# Patient Record
Sex: Male | Born: 1935 | Race: White | Hispanic: No | Marital: Married | Smoking: Former smoker
Health system: Southern US, Community
[De-identification: ages and names within clinical notes are randomized; demographics above are authoritative.]

## PROBLEM LIST (undated history)

## (undated) DIAGNOSIS — D369 Benign neoplasm, unspecified site: Secondary | ICD-10-CM

## (undated) DIAGNOSIS — I2510 Atherosclerotic heart disease of native coronary artery without angina pectoris: Secondary | ICD-10-CM

## (undated) DIAGNOSIS — H35039 Hypertensive retinopathy, unspecified eye: Secondary | ICD-10-CM

## (undated) DIAGNOSIS — N183 Chronic kidney disease, stage 3 unspecified: Secondary | ICD-10-CM

## (undated) DIAGNOSIS — G510 Bell's palsy: Secondary | ICD-10-CM

## (undated) DIAGNOSIS — C439 Malignant melanoma of skin, unspecified: Secondary | ICD-10-CM

## (undated) DIAGNOSIS — I452 Bifascicular block: Secondary | ICD-10-CM

## (undated) DIAGNOSIS — M4800 Spinal stenosis, site unspecified: Secondary | ICD-10-CM

## (undated) DIAGNOSIS — E785 Hyperlipidemia, unspecified: Secondary | ICD-10-CM

## (undated) DIAGNOSIS — E119 Type 2 diabetes mellitus without complications: Secondary | ICD-10-CM

## (undated) DIAGNOSIS — I10 Essential (primary) hypertension: Secondary | ICD-10-CM

## (undated) HISTORY — DX: Atherosclerotic heart disease of native coronary artery without angina pectoris: I25.10

## (undated) HISTORY — PX: OTHER SURGICAL HISTORY: SHX169

## (undated) HISTORY — PX: CORONARY ARTERY BYPASS GRAFT: SHX141

## (undated) HISTORY — DX: Malignant melanoma of skin, unspecified: C43.9

## (undated) HISTORY — DX: Essential (primary) hypertension: I10

## (undated) HISTORY — DX: Hyperlipidemia, unspecified: E78.5

## (undated) HISTORY — PX: TONSILLECTOMY AND ADENOIDECTOMY: SUR1326

## (undated) HISTORY — DX: Bifascicular block: I45.2

## (undated) HISTORY — DX: Spinal stenosis, site unspecified: M48.00

## (undated) HISTORY — PX: LAPAROSCOPIC APPENDECTOMY: SUR753

## (undated) HISTORY — DX: Chronic kidney disease, stage 3 unspecified: N18.30

## (undated) HISTORY — PX: MELANOMA EXCISION: SHX5266

## (undated) HISTORY — DX: Benign neoplasm, unspecified site: D36.9

## (undated) HISTORY — DX: Type 2 diabetes mellitus without complications: E11.9

## (undated) HISTORY — DX: Hypertensive retinopathy, unspecified eye: H35.039

## (undated) HISTORY — DX: Chronic kidney disease, stage 3 (moderate): N18.3

## (undated) HISTORY — DX: Bell's palsy: G51.0

---

## 2000-03-16 ENCOUNTER — Ambulatory Visit (HOSPITAL_COMMUNITY): Admission: RE | Admit: 2000-03-16 | Discharge: 2000-03-16 | Payer: Self-pay | Admitting: Gastroenterology

## 2000-03-16 ENCOUNTER — Encounter (INDEPENDENT_AMBULATORY_CARE_PROVIDER_SITE_OTHER): Payer: Self-pay | Admitting: *Deleted

## 2003-07-31 ENCOUNTER — Ambulatory Visit (HOSPITAL_COMMUNITY): Admission: RE | Admit: 2003-07-31 | Discharge: 2003-07-31 | Payer: Self-pay | Admitting: Gastroenterology

## 2003-07-31 ENCOUNTER — Encounter (INDEPENDENT_AMBULATORY_CARE_PROVIDER_SITE_OTHER): Payer: Self-pay | Admitting: *Deleted

## 2003-08-06 ENCOUNTER — Ambulatory Visit (HOSPITAL_COMMUNITY): Admission: RE | Admit: 2003-08-06 | Discharge: 2003-08-06 | Payer: Self-pay | Admitting: Orthopedic Surgery

## 2003-11-30 ENCOUNTER — Observation Stay (HOSPITAL_COMMUNITY): Admission: EM | Admit: 2003-11-30 | Discharge: 2003-12-01 | Payer: Self-pay | Admitting: Emergency Medicine

## 2003-11-30 ENCOUNTER — Encounter (INDEPENDENT_AMBULATORY_CARE_PROVIDER_SITE_OTHER): Payer: Self-pay | Admitting: *Deleted

## 2006-04-07 ENCOUNTER — Encounter: Admission: RE | Admit: 2006-04-07 | Discharge: 2006-04-07 | Payer: Self-pay | Admitting: Geriatric Medicine

## 2007-02-09 ENCOUNTER — Encounter: Payer: Self-pay | Admitting: Emergency Medicine

## 2007-02-09 ENCOUNTER — Inpatient Hospital Stay (HOSPITAL_COMMUNITY): Admission: AD | Admit: 2007-02-09 | Discharge: 2007-02-17 | Payer: Self-pay | Admitting: Cardiology

## 2007-02-11 ENCOUNTER — Encounter: Payer: Self-pay | Admitting: Cardiothoracic Surgery

## 2007-02-11 ENCOUNTER — Ambulatory Visit: Payer: Self-pay | Admitting: Cardiothoracic Surgery

## 2007-03-07 ENCOUNTER — Ambulatory Visit: Payer: Self-pay | Admitting: Cardiothoracic Surgery

## 2007-03-21 ENCOUNTER — Encounter (HOSPITAL_COMMUNITY): Admission: RE | Admit: 2007-03-21 | Discharge: 2007-05-15 | Payer: Self-pay | Admitting: Cardiology

## 2007-04-04 ENCOUNTER — Encounter: Admission: RE | Admit: 2007-04-04 | Discharge: 2007-04-04 | Payer: Self-pay | Admitting: Cardiothoracic Surgery

## 2007-04-04 ENCOUNTER — Ambulatory Visit: Payer: Self-pay | Admitting: Cardiothoracic Surgery

## 2007-04-05 ENCOUNTER — Ambulatory Visit (HOSPITAL_COMMUNITY): Admission: RE | Admit: 2007-04-05 | Discharge: 2007-04-05 | Payer: Self-pay | Admitting: Cardiothoracic Surgery

## 2007-04-05 ENCOUNTER — Encounter (INDEPENDENT_AMBULATORY_CARE_PROVIDER_SITE_OTHER): Payer: Self-pay | Admitting: Diagnostic Radiology

## 2007-04-18 ENCOUNTER — Ambulatory Visit: Payer: Self-pay | Admitting: Cardiothoracic Surgery

## 2007-04-18 ENCOUNTER — Encounter: Admission: RE | Admit: 2007-04-18 | Discharge: 2007-04-18 | Payer: Self-pay | Admitting: Cardiothoracic Surgery

## 2007-05-01 ENCOUNTER — Encounter: Admission: RE | Admit: 2007-05-01 | Discharge: 2007-05-01 | Payer: Self-pay | Admitting: Internal Medicine

## 2007-05-02 ENCOUNTER — Ambulatory Visit: Payer: Self-pay | Admitting: Cardiothoracic Surgery

## 2007-05-02 ENCOUNTER — Encounter: Admission: RE | Admit: 2007-05-02 | Discharge: 2007-05-02 | Payer: Self-pay | Admitting: Cardiothoracic Surgery

## 2007-05-16 ENCOUNTER — Encounter (HOSPITAL_COMMUNITY): Admission: RE | Admit: 2007-05-16 | Discharge: 2007-07-13 | Payer: Self-pay | Admitting: Cardiology

## 2007-06-04 ENCOUNTER — Ambulatory Visit (HOSPITAL_COMMUNITY): Admission: RE | Admit: 2007-06-04 | Discharge: 2007-06-05 | Payer: Self-pay | Admitting: Ophthalmology

## 2007-09-17 ENCOUNTER — Ambulatory Visit (HOSPITAL_COMMUNITY): Admission: RE | Admit: 2007-09-17 | Discharge: 2007-09-18 | Payer: Self-pay | Admitting: Ophthalmology

## 2007-12-06 IMAGING — CR DG CHEST DECUBITUS*L*
1 series · 1 of 1 positions shown · non-contrast
Comparison: Two views of the chest from [REDACTED] [place] from yesterday were reviewed.

CLINICAL DATA: Left pleural effusion.  
 LEFT LATERAL DECUBITUS CHEST:

[view not recorded]
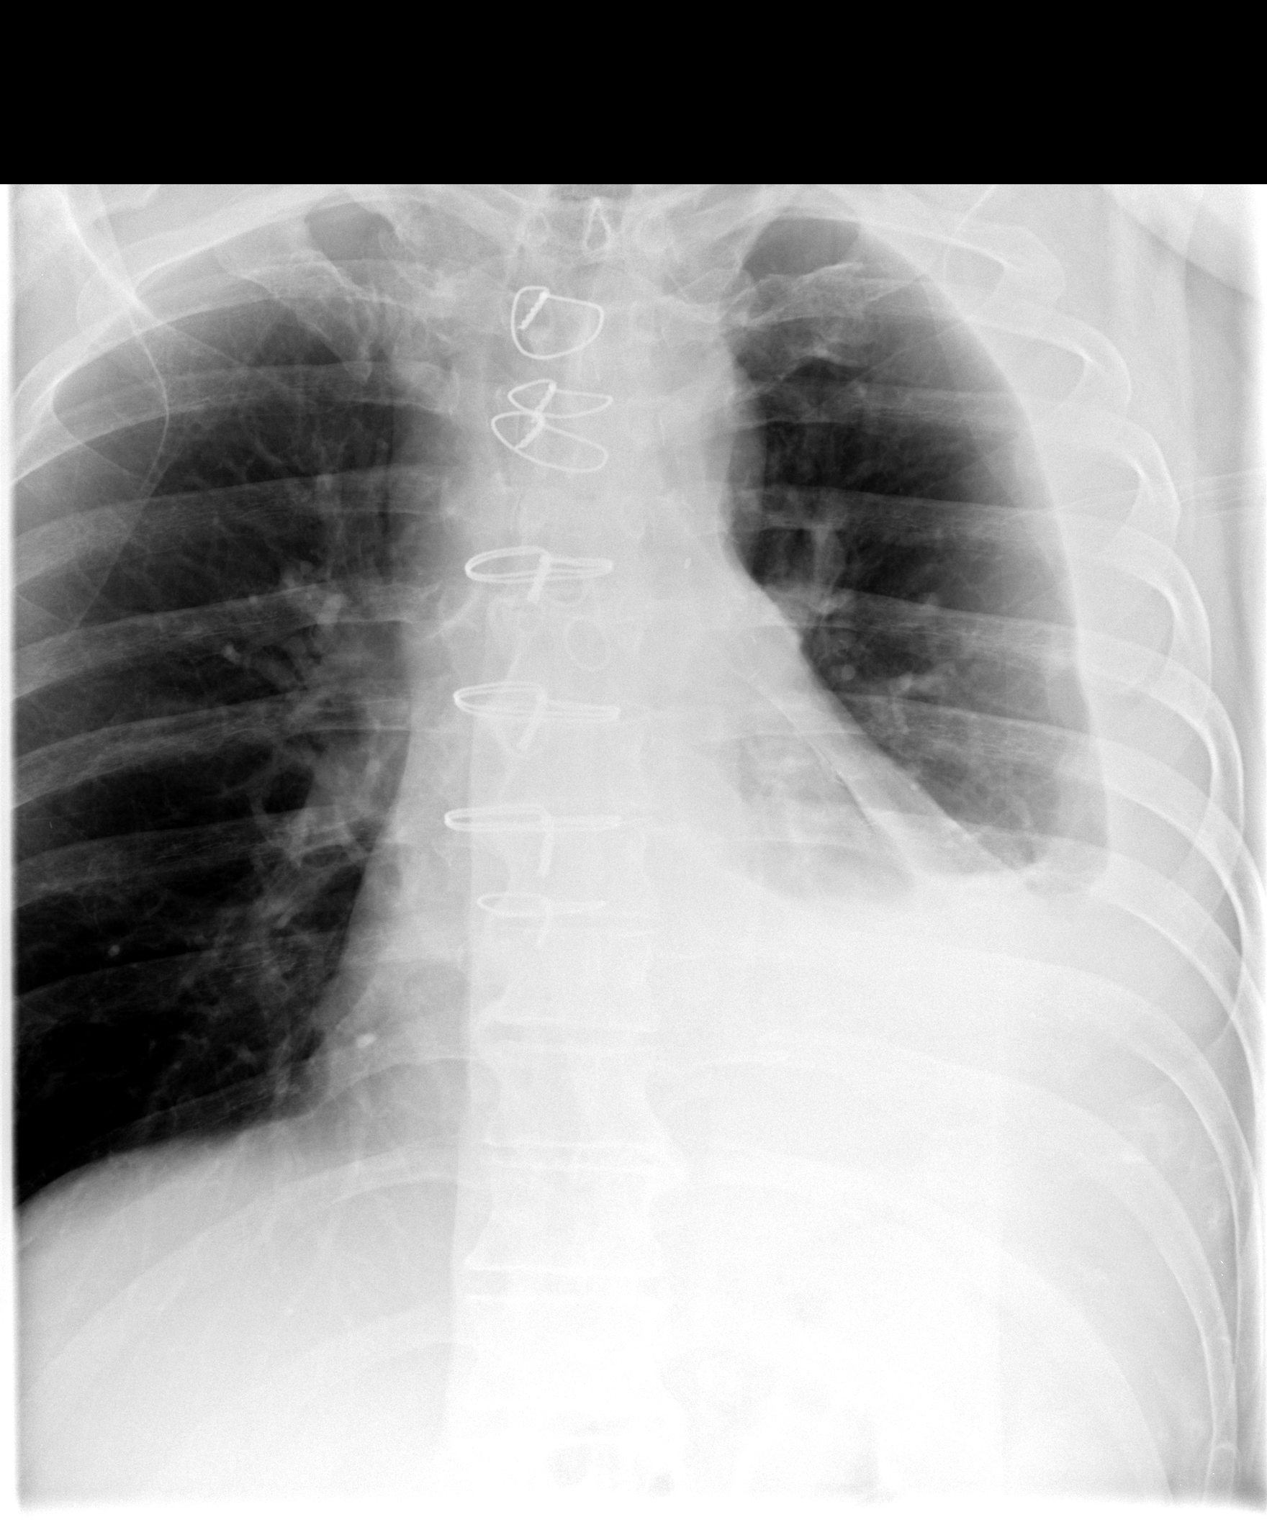

[1 of 1 positions shown; findings below may reference images not displayed]

FINDINGS: The left lateral decubitus shows a moderate size free flowing left pleural effusion to be present.  Cardiomegaly is again noted.
IMPRESSION: Moderate size free flowing left pleural effusion.

## 2007-12-07 IMAGING — CR DG CHEST 1V
1 series · 1 of 1 positions shown · non-contrast
Comparison: none

CLINICAL DATA: Left pleural effusion.  Status post thoracentesis.  
 CHEST ? 1 VIEW:

[w chest pa]
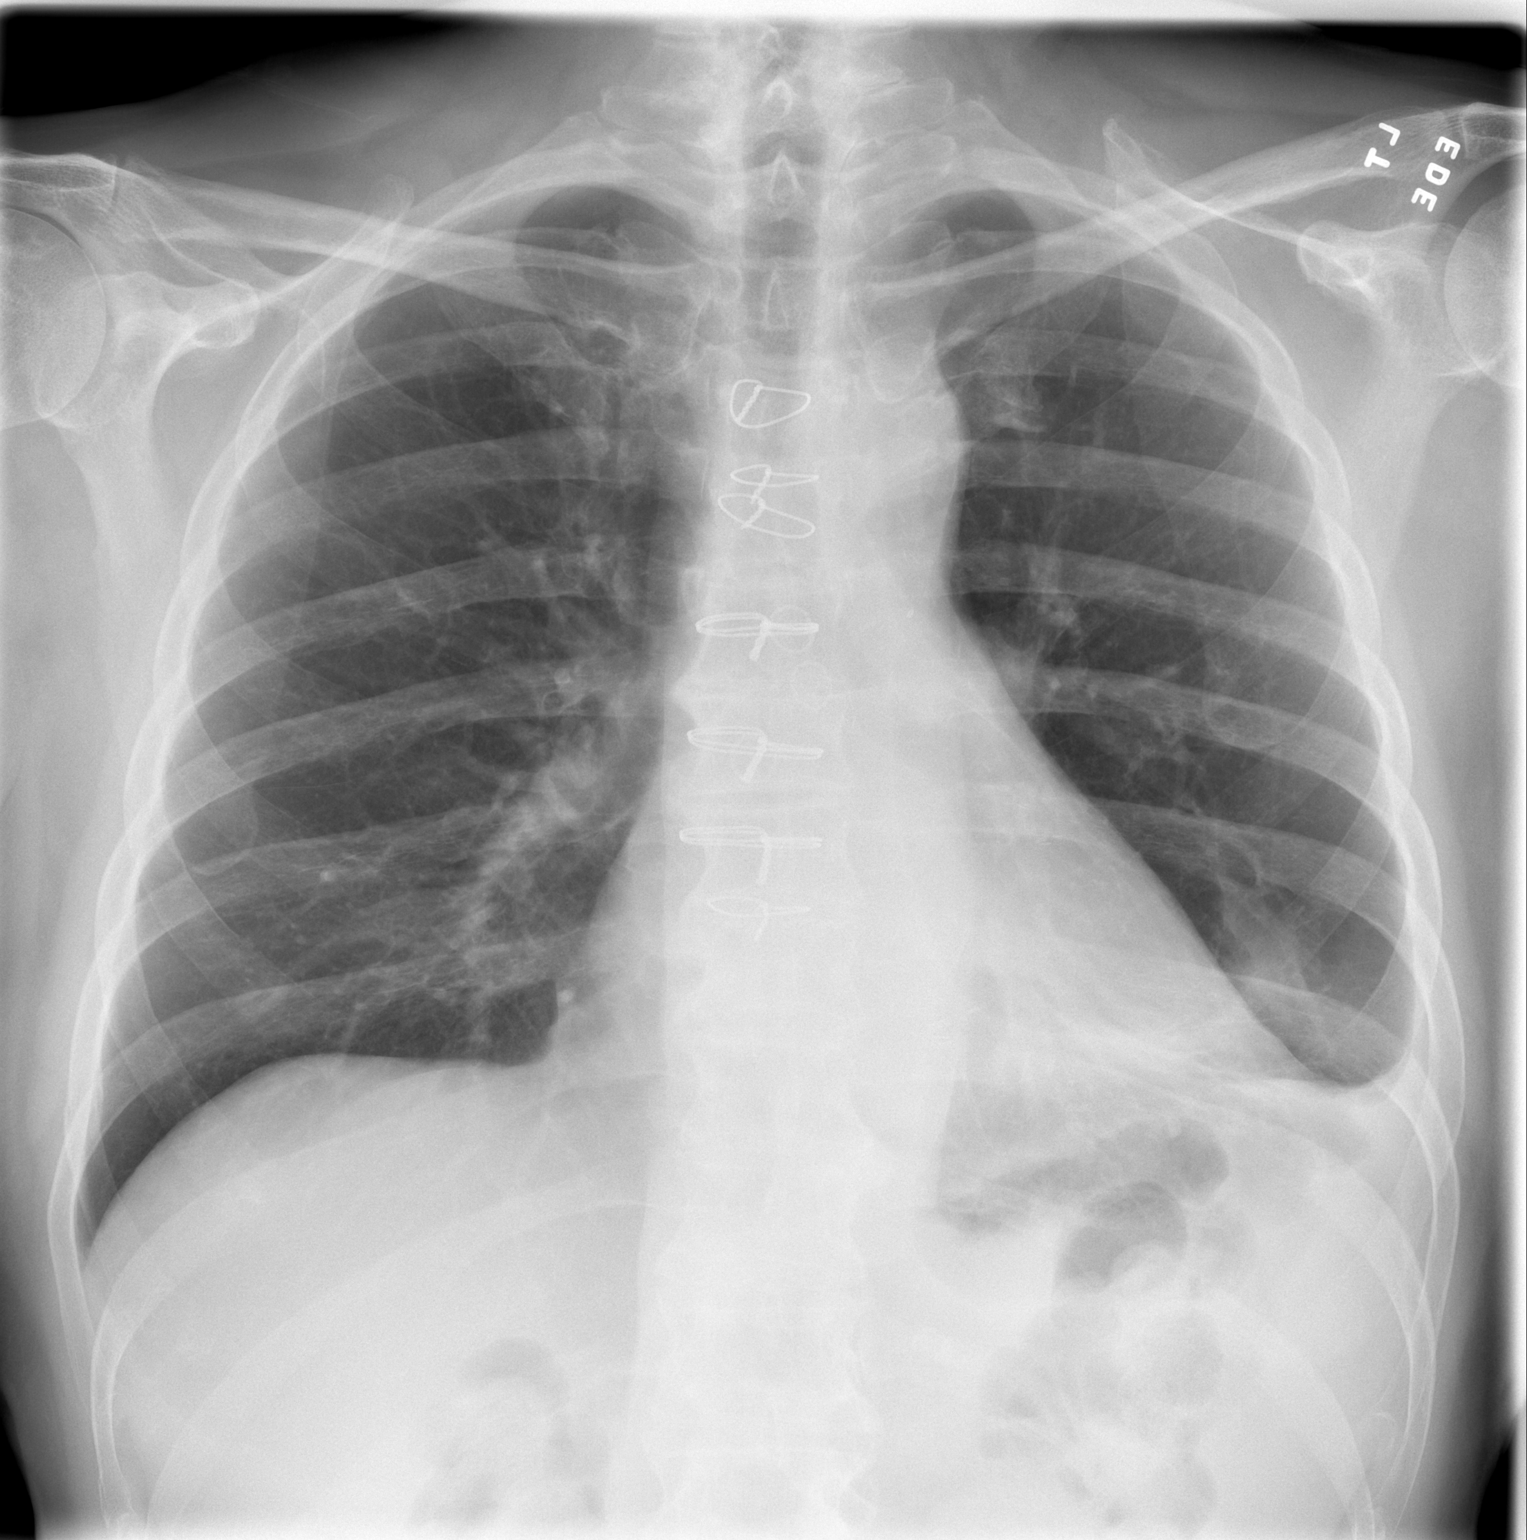

[1 of 1 positions shown; findings below may reference images not displayed]

FINDINGS: The patient is status post left thoracentesis.  No left pneumothorax.  Decrease in left pleural effusion.  Small left pleural effusion is noted now plus there are curvilinear densities at the left base compatible with subsegmental atelectasis.
IMPRESSION: Negative for pneumothorax.  Decrease in left pleural effusion post thoracentesis.  Minimal left pleural effusion and left base atelectasis.

## 2008-01-26 ENCOUNTER — Emergency Department (HOSPITAL_COMMUNITY): Admission: EM | Admit: 2008-01-26 | Discharge: 2008-01-26 | Payer: Self-pay | Admitting: Emergency Medicine

## 2008-02-09 ENCOUNTER — Encounter: Admission: RE | Admit: 2008-02-09 | Discharge: 2008-02-09 | Payer: Self-pay | Admitting: Urology

## 2009-08-28 ENCOUNTER — Encounter: Admission: RE | Admit: 2009-08-28 | Discharge: 2009-08-28 | Payer: Self-pay | Admitting: *Deleted

## 2010-05-31 ENCOUNTER — Other Ambulatory Visit: Payer: Self-pay | Admitting: Dermatology

## 2010-06-05 ENCOUNTER — Encounter: Payer: Self-pay | Admitting: Cardiothoracic Surgery

## 2010-09-01 ENCOUNTER — Other Ambulatory Visit: Payer: Self-pay | Admitting: Dermatology

## 2010-09-27 NOTE — Op Note (Signed)
NAMEALESSIO, BOGAN NO.:  [id number]   MEDICAL RECORD NO.:  [id number]          PATIENT TYPE:  INP   LOCATION:  2302                         FACILITY:  [facility]   PHYSICIAN:  Sheliah Plane, MD    DATE OF BIRTH:  20-Jan-1935   DATE OF PROCEDURE:  02/12/2007  DATE OF DISCHARGE:                               OPERATIVE REPORT   PREOPERATIVE DIAGNOSES:  Coronary occlusive disease.   POSTOPERATIVE DIAGNOSES:  Coronary occlusive disease.   SURGICAL PROCEDURES:  Coronary artery bypass grafting x4 with left  internal mammary to the left anterior descending coronary artery,  reverse saphenous vein graft to the diagonal, reverse saphenous vein  graft to the first obtuse marginal, reverse saphenous vein graft to the  posterior descending coronary artery with left leg endovein harvesting  thigh and calf.   SURGEON:  Dr. Tyrone Sage.   FIRST ASSISTANT:  Dr. Laneta Simmers.   SECOND ASSISTANT:  Zadie Rhine, PA.   BRIEF HISTORY:  The patient is a 76 year old male who presented with  unstable anginal symptoms. He underwent cardiac catheterization by Dr.  Amil Amen which demonstrated severe three-vessel coronary artery disease  with diffuse disease throughout a small nondominant right coronary  artery, greater than 80-90% stenosis of the LAD involving a large  diagonal, 80-90% circumflex disease involving a large first obtuse  marginal and a very small continuation branch. Because of the patient's  critical anatomy not suitable for angioplasty coronary artery bypass  grafting was recommended.  The patient agreed and signed informed  consent.   DESCRIPTION OF PROCEDURE:  With Swan-Ganz and arterial line monitors in  place, the patient underwent general endotracheal anesthesia without  incident.  The skin of the chest and legs was prepped with Betadine and  draped in the usual sterile manner.  Initially a small incision was made  in the right knee and endovein harvesting was  started; however, the vein  quickly branched. The incision was closed and an incision was made in  the left knee and the vein located. Using the [employer]ant endovein  harvesting system, the vein was harvested from the left thigh and calf.  It was of adequate quality and caliber. A median sternotomy was  performed.  The left internal mammary artery was dissected down as a  pedicle graft, the distal artery was divided and had good free flow.  The pericardium was opened.  Overall ventricular function appeared  preserved.  The patient was systemically heparinized, the ascending  aorta was cannulated in the aortic root vent. The cardioplegia needle  was introduced into the ascending aorta.  The patient was placed on  cardiopulmonary bypass 2.4 liters per minute per meter square.  Sites of  anastomosis were selected and dissected out of the epicardium.  The  patient's a body temperature was cooled to 30 degrees.  Aortic  crossclamp was applied. 500 mL of cold blood potassium cardioplegia was  administered with diastolic arrest. The heart and myocardial septal  temperature was monitored throughout the crossclamp.  Attention was  turned first to the distal right coronary artery. This artery was very  diffusely  diseased. In the mid portion of the posterior descending an  area that the vessel could be opened was identified and opened. It  admitted a 1-mm probe distally and 1.5-mm probe proximally. Using a  running 7-0 Prolene, distal anastomosis was performed with reverse  saphenous vein graft.  Attention was then turned to the obtuse marginal  coronary artery which was opened. The distal extent of the circumflex  was very small,  less than 1 mm and was not bypassed. The obtuse  marginal was 1.8 to 2 mm in size. Using a running 7-0 Prolene, distal  anastomosis was performed.  Attention was then turned to the diagonal  coronary artery which was opened and admitted a 1.5-mm probe.  Using a  running 7-0  Prolene, distal anastomosis was performed.  The left  internal mammary artery had been dissected down to the distal pedicle  graft and it was trimmed to the appropriate length and between the mid  and distal third of the LAD, the vessel was opened. Using a running 8-0  Prolene, the left internal mammary artery was anastomosed to the left  anterior descending coronary artery. There was a very small diagonal  branch that was located and the anastomosis to the LAD was distal to  this. The patient had sequential lesions in the LAD and the most distal  one was just proximal to the small diagonal. With release of the bulldog  on the mammary artery, there was a rise in myocardial septal  temperature.  The bulldog was replaced. The  mammary is tacked to the  epicardium with the crossclamp still in place, three punch aortotomies  were performed.  Each of the three vein grafts were anastomosed to the  ascending aorta. Air was evacuated from the grafts and the aortic  crossclamp was removed.  Total crossclamp time 85 minutes.  The patient  spontaneously converted to a sinus rhythm. Several repair stitches were  placed in the anastomosis of the posterior descending. The patient was  then ventilated and weaned for cardiopulmonary bypass without  difficulty.  He remained hemodynamically stable. He was decannulated in  the usual fashion.  Protamine sulfate was administered with the  operative field hemostatic.  Atrial and ventricular pacing wires were  applied.  A left pleural tube and a  Blake mediastinal drain were left  in place.  Graft markers were left in place. The pericardium was  reapproximated.  The sternum was closed with #6 stainless steel wire.  The fascia was closed with interrupted #0 Vicryl, running 3-0 Vicryl in  the subcutaneous tissue and a 4-0 subcuticular stitch in the skin edges.  Dry dressings were applied.  Sponge and needle count was reported as  correct at the completion of the  procedure.  The patient tolerated the  procedure without obvious complication and was transferred to the  surgical intensive care unit for further postoperative care.      Sheliah Plane, MD  Electronically Signed     EG/MEDQ  D:  02/13/2007  T:  02/14/2007  Job:  [id number]   cc:   Francisca December, M.D.

## 2010-09-27 NOTE — Consult Note (Signed)
[name], [name] NO.:  [id number]   MEDICAL RECORD NO.:  [id number]          PATIENT TYPE:  INP   LOCATION:  2028                         FACILITY:  [facility]   PHYSICIAN:  Sheliah Plane, MD    DATE OF BIRTH:  [birth year]   DATE OF CONSULTATION:  02/11/2007  DATE OF DISCHARGE:                                 CONSULTATION   REASON FOR CONSULTATION:  Three-vessel coronary artery disease.   HISTORY OF PRESENT ILLNESS:  Patient with no previous documented history  of coronary artery disease awoke on 02/09/07, with mid substernal chest  discomfort nonradiating and with diaphoresis that lasted approximately  1.5 hours. The patient awoke and initially tried drinking a soft drink,  even took a shot of bourbon with no relief of substernal chest  discomfort and came into the emergency room. At the time his peak  troponin was 0.25, it went to 0.83. CK-MBs were not elevated. The  patient became pain free on his way to the emergency room and has had no  recurrent pain since. He has known hypertension. Has new diagnosis of  hyperlipidemia with elevated triglycerides. History of type 2 diabetes.  Last hemoglobin A1c is 6.1   He is a remote smoker. Quit smoking more than 25 years ago.   FAMILY HISTORY:  Significant for myocardial infarction resulting in  death in his father at age 44. He has 1 brother who died of COPD and  heart problems. The patient has had no previous history of stroke, no  claudication, no known renal insufficiency.   PAST SURGICAL HISTORY:  1. 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  2. Laparoscopic appendectomy in 2005.  3. Tonsillectomy and adenoidectomy as a child.  4. Gum surgery 4 years ago.  5. Left eye cataract removed and retinal eye surgery  in the past.   SOCIAL HISTORY:  The patient is married and lives in [place], retired  Psychologist, occupational.   MEDICATIONS:  Include  1. Januvia 100 mg a day.  2. Amaryl 4 mg b.i.d.  3. Amlodipine 10 mg a  day.  4. Actos 15 mg a day.  5. The patient also had been on lisinopril 10 mg a day.   ALLERGIES:  NO KNOWN ALLERGIES.   CARDIAC REVIEW OF SYSTEMS:  Positive for chest pain. Negative for  resting shortness of breath, exertional shortness of breath, orthopnea,  presyncope, syncope, palpitations, lower-extremity edema. He does not  increased tiredness especially when playing golf but no definite chest  pain.   GENERAL REVIEW OF SYSTEMS:  Denies constitutional symptoms. Denies  fevers, chills or night sweats. RESPIRATORY: Denies hemoptysis.  GASTROINTESTINAL: Had no change in bowel habits. No blood in his stool.  NEUROLOGIC: Denies amaurosis fugax. MUSCULOSKELETAL: Chronic low back  pain. GU: Note some urinary hesitancy. Denies nocturia. ENDOCRINE: Has a  history of diabetes since 1992. PSYCHIATRIC: Denies history. HEENT:  Wears glasses, has some decrease in hearing. Has upper and lower  dentures.   PHYSICAL EXAMINATION:  VITAL SIGNS: Blood pressure is 119/70, pulse is  70, respiratory rate is 18, 5 feet 9 inches tall. Weighs  90 kilos.  GENERAL: The patient is awake, alert, neurologically intact.  HEENT: Pupils are equal, round, reactive to light.  NECK: The neck is without carotid bruits.  LUNGS: Are clear bilaterally.  CARDIAC: Exam reveals regular rate and rhythm without murmur or gallop.  ABDOMEN: Exam is benign without palpable masses or tenderness.  GROINS: Examination reveals 2+ palpable femoral pulses bilaterally. A  closure device without hematoma was placed in the right groin. He has +1  DP and PT pulses bilaterally.   LABORATORY FINDINGS:  Include a creatinine of 1.3, glucose 172.  Hematocrit is 34.4, platelet count 172.   Cardiac catheterization films were reviewed. Has normal systolic focal  function, 80% LAD at the septal perforator, distal 90%, the diagonal has  80%. First obtuse marginal has a 70% ostial. Very diffuse disease  throughout the right coronary  system.   IMPRESSION:  The patient is a 76 year old male with diabetes who  presents with significant 3-vessel coronary artery disease, unstable  anginal symptoms with elevation of his troponins. The patient's  disease  is multivessel and not amenable to angioplasty. I agree with Dr. Amil Amen  that the long-term solution is coronary artery bypass grafting. The  risks and options are discussed with the patient and his family in  detail. The risk of death, infection, stroke, myocardial infarction,  bleeding, blood transfusion, all have been discussed. The patient is  willing to proceed. Will make arrangements to proceed this  hospitalization on 9/30.   The patient is agreeable.      Sheliah Plane, MD  Electronically Signed     EG/MEDQ  D:  02/11/2007  T:  02/11/2007  Job:  [id number]   cc:   Francisca December, M.D.

## 2010-09-27 NOTE — Assessment & Plan Note (Signed)
OFFICE VISIT   WEILAND, TOMICH  DOB:  [birth year]                                        April 04, 2007  CHART #:  [id number]   HISTORY OF PRESENT ILLNESS:  Mr. Homan returns to the office today in  followup after coronary artery bypass grafting x4 on February 12, 2007.  He was seen about a month ago. Postoperatively, he was doing well with  the exception of a small left pleural effusion. He was started on Lasix  and potassium and was scheduled for return today with a followup chest x-  ray. He has started in cardiac rehabilitation and is doing well without  recurrent evidence of angina or congestive heart failure. He has had no  shortness of breath.   PHYSICAL EXAMINATION:  VITAL SIGNS:  Blood pressure 128/77, pulse 54 and  regular. Respiratory rate 18. O2 saturation is 98%.  CHEST:  The patient's sternum is stable and well healed.  EXTREMITIES:  His left end of vein harvest site is healing well. There  is some thickening and probably old hematoma in the tunnel site in the  left lower leg but this appears to be decreasing in size and is not  inflamed or infected. He has no pedal edema.   DIAGNOSTIC STUDIES:  Followup chest x-ray shows small to moderate left  pleural effusion, about the same as one month previously.   IMPRESSION/PLAN:  The patient did not have a decubitus film today. I  have discussed this with him. We will plan to obtain a decubitus film  and after review of that, if there is free fluid, plan on ultrasound  guided thoracentesis to drain the fluid. Otherwise, I will plan to see  him back with a followup chest x-ray in 1 month.   Sheliah Plane, MD  Electronically Signed   EG/MEDQ  D:  04/04/2007  T:  04/04/2007  Job:  [id number]

## 2010-09-27 NOTE — Assessment & Plan Note (Signed)
OFFICE VISIT   [name], [name]  DOB:  [birth year]                                        May 02, 2007  CHART #:  [id number]   The patient returns to the office today with a followup chest x-ray.  He  underwent coronary artery bypass grafting on 02/12/2007.  During the  postoperative period, he returned with a left pleural effusion, and on  11/21 underwent left thoracentesis and drainage of 1.2L of pleural  fluid.  The cytology in the pleural fluid showed reactive mesothelial  cells only.  The patient returns now for a followup chest x-ray.  He has  been doing well without any new symptoms of shortness of breath or  congestive heart failure.  He is currently enrolled in the cardiac rehab  program and progressing well.   EXAM:  His blood pressure is 126/77, pulse 59, respiratory rate is 18,  O2 saturation is 98%.  Sternum is stable and well-healed.  His lungs are  clear bilaterally.  He has no pedal edema.   Followup chest x-ray shows marked improvement of left effusion and left  lower lobe atelectasis compared to his previous films.   Overall, I am very pleased with his progress.  I have not made a return  appointment for him to see me.  He continues to be followed by Dr.  Amil Amen and Dr. Merlene Laughter.   Kyle Plane, MD  Electronically Signed   EG/MEDQ  D:  05/02/2007  T:  05/03/2007  Job:  [id number]   cc:   Dr. Merlene Laughter  Francisca December, M.D.

## 2010-09-27 NOTE — H&P (Signed)
[name], WENTWORTH NO.:  [id number]   MEDICAL RECORD NO.:  [id number]          PATIENT TYPE:  INP   LOCATION:  0102                         FACILITY:  [facility]   PHYSICIAN:  Francisca December, M.D.  DATE OF BIRTH:  1935/03/09   DATE OF ADMISSION:  02/09/2007  DATE OF DISCHARGE:                              HISTORY & PHYSICAL   REASON FOR ADMISSION:  Chest pain.   HISTORY OF PRESENT ILLNESS:  The patient is a 76 year old man without  prior cardiac history who awoke at approximately of 0300 hours this  morning with anterior substernal chest pain described as burning and  pressure.  It did not radiate.  It was not associated with nausea,  vomiting or diaphoresis.  He was not particularly short of breath.  He  sat on the edge of the bed for some time without resolution.  He finally  took a baby aspirin and 2 ounces of a liquor without any resolution.  Finally, he awoke his wife and asked her to bring him to the hospital at  around 0500-0600 hours.  En route the discomfort primarily resolved.  He  denies any prior episodes of this discomfort either at rest or with  exertion.  He has multiple risk factors for coronary heart disease  (please see below).   PAST MEDICAL HISTORY:  1. Hypertension.  2. Diabetes mellitus.  3. There is no history of peptic ulcer disease, prior tuberculosis or      pneumonia, CVA, renal disorder, cancer or tumors, thyroid disorder,      or neurologic problems.   SOCIAL HISTORY:  The patient is a retired Psychologist, occupational.  He is married.  He  has a remote history of tobacco use over 25 years ago and daily cocktail  of about 2 ounces of distilled spirits.  Caffeine intake is not that  excessive.   ALLERGIES:  Drug allergies; none known.   CURRENT MEDICATIONS:  Januvia, Actos, Monopril and Amaryl; currently the  doses are unknown.   FAMILY HISTORY:  The family history is positive for early coronary  disease.  His father died of a myocardial  infarction at age 17.   REVIEW OF SYSTEMS:  The patient denies chronic headache or visual  changes.  No auditory difficulty.  Teeth and gums are in good repair.  There is no history of thyroid enlargement or difficulty swallowing.  No  cough or hemoptysis.  No abdominal pain, nausea, hematemesis,  hematochezia, or melena.  He does have difficulty starting his stream of  urine.  No dysuria.  No problems with significant arthritis.  No chronic  back pain.  He has no muscle weakness or tenderness.  He  has never had  a stroke or a seizure to his knowledge.   PHYSICAL EXAMINATION:  VITAL SIGNS:  The blood pressure is 120/70.  Of  note, he took his blood pressure while he was having the chest pain and  it was 200/100.  The pulse currently is 70, respirations 18 and he is  afebrile.  GENERAL APPEARANCE:  In general this is a comfortable, well-appearing  63-  year-old Caucasian man in no distress.  HEENT:  The head, eyes, ears, nose and throat are unremarkable.  Head is  atraumatic and normocephalic.  The pupils are equal, round and react to  light and accommodation.  Extraocular movements are intact.  Oral mucosa  is pink and moist.  Teeth and gums are in good repair.  Tongue is not  coated.  NECK:  The neck is supple without thyromegaly or masses.  The carotid  upstrokes are normal.  There is no bruit.  There is no jugular venous  distention.  CHEST:  The patient's chest is clear with adequate excursions.  Normal  vesicular breath sounds are heard throughout.  HEART:  The precordium is quiet.  Normal S1 and S2 are heard.  No S3,  S4, murmur, click, or rub are noted.  PMI is over the left ventricular  apex, single, discrete and nondisplaced.  ABDOMEN:  The abdomen is soft, flat and nontender without  hepatosplenomegaly or midline pulsatile mass.  Bowel sounds are present  in all quadrants.  GENITALIA:  The external genitalia shows a normal male phallus and  descended testicles.  No  lesions.  RECTAL:  The rectal is not performed.  EXTREMITIES:  The extremities show full range of motion.  No edema.  Intact femoral, popliteal and dorsalis pedis pulses.  Posterior tibial  pulses are not well palpated bilaterally.  Range of motion is full.  NEUROLOGIC EXAMINATION:  Neurologically the cranial nerves II-XII are  intact.  Motor and sensory grossly intact.  Gait is not tested.  SKIN:  The skin is warm, dry and clear.   ACCESSORY CLINICAL DATA:  The admission hemogram, serum electrolytes,  BUN and creatinine are all within normal limits.  Glucose is 180.  The  initial CK/MB was 1 and has remained so from 0700-0840 hours.  The  initial troponins were 0.25, then 0.23 and then 0.25 again at the last  testing at  0840 hours.  Myoglobin remained normal.  Electrocardiogram  showed a right bundle branch block, which is chronic, and flattened T-  waves in V3-V6.  Chest x-ray showed no active cardiopulmonary disease.   ASSESSMENT:  This is a 76 year old man with a fairly clear-cut episode  of unstable angina pectoris.  He has multiple risk factors for coronary  heart disease including hypertension, diabetes mellitus, male sex, age,  and a positive family history.  His lipids are unknown.  He is a very  remote smoker.   PLAN:  1. Admission to the telemetry unit.  2. Topical nitroglycerin.  3. Beta blockers.  4. Aspirin; and,  5. Full-dose Lovenox.   I have discussed the possibility of a cardiac catheterization with the  patient.  Goals, risks and alternatives were reviewed as well as the  possibility of a PCI and/or coronary bypass surgery.  The patient states  his understanding.  He has had his questions answered and wishes to  proceed.      Francisca December, M.D.  Electronically Signed     JHE/MEDQ  D:  02/09/2007  T:  02/10/2007  Job:  [id number]   cc:   Hal T. Stoneking, M.D.  Fax: [phone]

## 2010-09-27 NOTE — Op Note (Signed)
[name], [name] NO.:  [id number]   MEDICAL RECORD NO.:  [id number]          PATIENT TYPE:  OIB   LOCATION:  5128                         FACILITY:  [facility]   PHYSICIAN:  Beulah Gandy. Ashley Royalty, M.D. 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   PROCEDURE:  Scleral buckle, left eye; retinal photocoagulation, left  eye; and gas injection, left eye.   SURGEON:  Beulah Gandy. Ashley Royalty, M.D.   ASSISTANT:  Bryan Lemma. Lundquist, P.A.   ANESTHESIA:  General.   DETAILS:  Usual prep and drape.  360-degree limbal peritomy.  Isolation  of four rectus muscles on 2-0 silk.  Localization of detachment from 2  o'clock to 7 o'clock.  Scleral dissection per 360 degrees to admit  #279  intrascleral implant.  Diathermy placed in the bed.  A 279 implant was  placed with the joint at 10 o'clock.  A 240 band was placed with a 270  sleeve at 10 o'clock.  Two sutures per quadrant for a total of eight  scleral sutures were placed in the scleral flaps.  Perforation site was  chosen at 5 o'clock.  A small amount of clear, colorless subretinal  fluid came forth.  The scleral sutures were drawn securely and the  buckle was shortened.  Indirect ophthalmoscopy showed the retina be  lying nicely in place with some subretinal fluid anterior to the buckle.  The indirect ophthalmoscope laser was moved into place, 991 burns were  placed around the retinal periphery with a power of 500 mW, 1000 microns  each and 0.1 seconds each.  The buckle again was adjusted and trimmed.  The band was adjusted and trimmed.  The sutures were knotted and the  free ends removed.  C3F8, 0.8 mL, 100% was injected into the vitreous  cavity.  Paracentesis x2 obtained closing pressure of 10 with a  Barraquer tonometer.  The conjunctiva was reposited with 7-0 chromic  suture.  Polymyxin and gentamicin were irrigated to [place] space.  No  atropine was used.  Marcaine was injected around the globe for postop  pain.  Decadron 10 mg was injected to the lower subconjunctival space.  TobraDex ophthalmic ointment, a patch, and shield were placed.  The  patient was awakened and taken to recovery in satisfactory condition.      Beulah Gandy. Ashley Royalty, M.D.  Electronically Signed     JDM/MEDQ  D:  09/17/2007  T:  09/18/2007  Job:  [id number]

## 2010-09-27 NOTE — Assessment & Plan Note (Signed)
OFFICE VISIT   [name], [name]  DOB:  1935/02/02                                        April 18, 2007  CHART #:  [id number]   The patient is status post coronary artery bypass grafting x4 done  February 12, 2007.  The patient was recently seen in the office  April 04, 2007, for followup of small left pleural effusion.  At that  time, left pleural effusion had increased and required ultrasound guided  left lower centesis.  This was done April 05, 2007, and 1.2 L were  removed.  Chest x-ray following thoracentesis showed improvement of left  pleural effusion with no pneumothorax.  The patient presents today to  the office for followup status post thoracentesis on the left.  The  patient without complaints today  denies any shortness of breath with  ambulation or orthopnea, or PND.  The patient does state that during  cardiac rehab, he does become bradycardic, asymptomatic.  States the  cardiac rehab nurses called Dr. Amil Amen, and nothing was done at that  time.  States that he was told during cardiac rehab, the lowest heart  rate was dropped to 47 with other heart rates in the 50s.  The patient  continues to ambulate well.   PHYSICAL EXAM:  Vitals:  Blood pressure 128/79, pulse of 56,  respirations of 18, O2 saturations are 98%.  Respiratory:  Diminished breath sounds left lung.  Cardiac:  Regular  rate and rhythm with S1 and S2 noted.  All incisions continue to heal  well.   DIAGNOSTIC STUDIES:  Chest x-ray done today, December 4, shows a small  left pleural effusion.  No pneumothorax noted.   IMPRESSION AND PLAN:  The patient is seen status post coronary artery  bypass grafting with postoperative left pleural effusion.  Noted still  small left pleural effusion but plan to monitor this.  Will obtain  followup P&A chest x-ray next several weeks and return for evaluation.  Due to the patient's bradycardia, told the patient to decrease his  Lopressor to 12.5 mg p.o. b.i.d.  Was told to cut the 25 mg pill in  half.  The patient states he has an appointment with Dr. Amil Amen next  week.  Told to keep this appointment.  He is to come back to our office  if he develops any  significant shortness of breath or drainage __________ incision sites.  The patient acknowledge understanding.  The patient was seen and  evaluated by Dr. Tyrone Sage.   Sheliah Plane, MD  Electronically Signed   KMD/MEDQ  D:  04/18/2007  T:  04/18/2007  Job:  [id number]   cc:   Francisca December, M.D.

## 2010-09-27 NOTE — Cardiovascular Report (Signed)
[name], [name] NO.:  [id number]   MEDICAL RECORD NO.:  [id number]          PATIENT TYPE:  INP   LOCATION:  2028                         FACILITY:  [facility]   PHYSICIAN:  Francisca December, M.D.  DATE OF BIRTH:  February 19, 1935   DATE OF PROCEDURE:  02/11/2007  DATE OF DISCHARGE:                            CARDIAC CATHETERIZATION   PROCEDURES PERFORMED:  1. Left heart catheterization.  2. Left ventriculogram.  3. Coronary angiography.   INDICATIONS:  [name] is a 76 year old man who presented to  [facility] emergency room on February 09, 2007, with prolonged  anterior substernal chest pain.  He had slightly elevated cardiac  enzymes with a troponin in the 0.25 range, subsequently rose to 0.83.  There were no diagnostic electrocardiographic changes.  He is diabetic  and hypertensive.  He is brought to catheterization laboratory at this  time to identify the extent of disease and provide for further  therapeutic options.   PROCEDURAL NOTE:  The patient was brought to cardiac catheterization  laboratory in the fasting state.  The right groin was prepped and draped  in the usual sterile fashion.  Local anesthesia was obtained with  infiltration of 1% lidocaine.  A 6-French catheter sheath was inserted  percutaneously into the right femoral artery utilizing an anterior  approach over guiding J-wire.  Left heart catheterization and coronary  angiography then proceeded in the standard fashion using 6-French #4  left and right Judkins catheters and a 110-cm pigtail catheter.  A 30  degrees RAO cine left ventriculogram was performed utilizing a power  injector . Next, 40 mL were injected at 13 mL per second.  All catheter  manipulations were performed using fluoroscopic observation and  exchanges performed over long guiding J-wire.  At the completion of  procedure, a right femoral arteriogram in the 45 degrees RAO angulation  via the catheter sheath by hand  injection documented adequate anatomy  for placement of the percutaneous closure device Angio-Seal.  This was  subsequently deployed with good hemostasis and an intact distal pulse.   HEMODYNAMIC RESULTS:  Systemic arterial pressure was 104/58 with a mean  of 76 mmHg.  There was no systolic gradient across the aortic valve.  Left ventricular end-diastolic pressure was 10 mmHg pre-ventriculogram.   ANGIOGRAPHY:  A left ventriculogram demonstrated normal chamber size and  normal global systolic function without regional wall motion  abnormality.  There was left and right coronary calcification seen.  There was mild mitral vegetation.  The aortic valve is trileaflet and  opens normally during systole.  A visual estimate of the ejection  fraction is 65-70%.   There is a right-dominant coronary system present.  The main left  coronary is normal.   The left anterior descending artery and its branches are highly  diseased; the vessel contains a diffuse 80% narrowing which begins at  the origin of the first septal perforator and extends to the third  septal perforator over a distance of approximately 22 mm.  Then there is  a subtotal stenosis of 95% range which is focal at the  junction of the  mid and distal portion.  As well, there is a large diagonal branch that  arises from within the diffuse disease in the midportion of the vessel  and it is 80% stenotic at its origin, but it does extend about 15 mm  distally into the diagonal itself.   The anterior descending artery is large and traverses the apex.   The left circumflex coronary and its branches are highly diseased; the  vessel itself does not have any obstruction until the distal portion  just before the origin of a small posterolateral branch.  There is an  80% tubular narrowing.  There are luminal irregularities in the proximal  segment.  Two small marginal branches arise and then the third marginal  is a dominant vessel on the  lateral wall of the heart.  It has a 70%  stenosis at its origin.   The right coronary artery and its branches are highly diseased; the  vessel is relatively small and contains a 40% narrowing in the  midportion, but it is diffusely diseased throughout the proximal and  midportion.  The distal vessel contains luminal irregularities only.  It  then gives rise to a moderate-sized poster descending artery which has a  diffuse 70% stenosis within the midportion.  The posterolateral segment  is small and gives rise only to a very small left ventricular branch.   Collateral vessels are not seen.   FINAL IMPRESSION:  1. Atherosclerotic cardiovascular disease, three-vessel.  2. Intact ventricular size and global systolic function, ejection      fraction 65%.   PLAN:  The patient will be referred for coronary artery bypass surgery.  He would be best served by this modality given his extensive disease and  anterior descending artery and diagonal branch as well as diabetic  state.      Francisca December, M.D.  Electronically Signed     JHE/MEDQ  D:  02/11/2007  T:  02/11/2007  Job:  [id number]   cc:   Hal T. Stoneking, M.D.

## 2010-09-27 NOTE — Assessment & Plan Note (Signed)
OFFICE VISIT   ASAH, LAMAY  DOB:  05-07-1935                                        March 07, 2007  CHART #:  [id number]   Patient returns today after having coronary artery bypass grafting x4  done on February 12, 2007.  Postoperatively, he has been doing well.  He has had some fatigue but no angina or evidence of congestive heart  failure.  He is increasing his physical activity and plans to start in  cardiac rehab on November 1st.   He was seen by the nurse in Dr. Amil Amen' office last week and was given  a 7-day course of Lasix and potassium presumably because of a small left  pleural.   EXAM:  Now, his blood pressure 129/76.  Pulse is 58.  Respiratory rate  is 18.  O2 sat is 95%.  His sternum is stable and well healed.  He has  some thickening along the endovein harvest site in the left leg,  especially below the knee but without any evidence of infection or with  edema.   Chest x-ray done in Dr. Amil Amen' office last week is reviewed and has a  small left pleural effusion.   The patient currently is on Amaril 4 mg twice a day, Lopressor 25 twice  a day, Lipitor 20 once a day, folic acid 1 mg a day, aspirin 1 a day,  Pacerone 200 b.i.d., Januvia 100 mg a day, Actos 15 mg a day, Lasix 40  mg daily for 7 days, 20 mEq of potassium for 7 days.  I have asked him  to stop the folic acid when the current prescription is completed and  also to decrease his Pacerone to 200 mg once a day and when he sees Dr.  Amil Amen to discuss stopping it in the next 3 or 4  weeks.  I have made him a return appointment with a chest x-ray to see  me in 4 weeks to ensure the left pleural effusion is completely  resolved.   Sheliah Plane, MD  Electronically Signed   EG/MEDQ  D:  03/07/2007  T:  03/08/2007  Job:  [id number]   cc:   Francisca December, M.D.

## 2010-09-27 NOTE — Discharge Summary (Signed)
NAMEGERONIMO, [name] NO.:  [id number]   MEDICAL RECORD NO.:  [id number]          PATIENT TYPE:  INP   LOCATION:  2027                         FACILITY:  [facility]   PHYSICIAN:  Sheliah Plane, MD    DATE OF BIRTH:  06/12/1934   DATE OF ADMISSION:  02/11/2007  DATE OF DISCHARGE:  02/17/2007                               DISCHARGE SUMMARY   HISTORY OF PRESENT ILLNESS:  The patient is a 76 year old male with no  previous documented history of coronary artery disease, who awoke on  February 09, 2007 with mid substernal chest discomfort associated with  diaphoresis that last approximately 1.5 hours.  The pain was non-  radiating.  He awoke and tried to drink a soft drink, and he even took a  shot of bourbon with no relief of symptoms.  He then presented to the  emergency room.  He had an elevated troponin of 0.25 that increased to  0.83.  His CK-MBs were not elevated.  He was felt to require admission  for further evaluation and treatment.  He was seen by Dr. Ty Hilts from  [facility].  His presumed diagnosis was unstable angina pectoris.  He was started on topical nitrates, beta-blocker, aspirin and Lovenox.  He was admitted to telemetry and plan for cardiac catheterization.   PAST MEDICAL HISTORY:  1. Hypertension.  2. Diabetes.  3. Former tobacco abuse.   PAST SURGICAL HISTORY:  A skin carcinoma removed from the scalp.  He has  also undergone a laparoscopic appendectomy in 2005, tonsillectomy and  adenoidectomy as a child, gum surgery 4 years ago, left cataract surgery  and retinal eye surgery in the past.   MEDICATIONS PRIOR TO ADMISSION:  Included the following:  1. Januvia 100-mg tablet once daily.  2. Glimepiride 4 mg twice daily.  3. Amlodipine 10 mg once daily.  4. Fosinopril 10 mg daily.  5. Actos 15 mg daily.   ALLERGIES:  No known drug allergies.   Family history, social history, review of systems and physical exam,  please see history and  physical done at the time of admission.   HOSPITAL COURSE:  The patient was admitted as stated, stabilized from  the medical viewpoint.  He was taken to the cardiac catheterization for  left heart catheterization with angiograms and left ventriculogram.  Findings were notable for normal multivessel coronary disease, including  80% diffuse distal disease in the LAD with a 90% focal lesion also in  the LAD.  The diagonal also had an 80% diffuse lesion.  The left  circumflex had and obtuse marginal with a 70% lesion at its origin, and  the right coronary artery had diffuse disease.  Due to these findings,  surgical consultation was obtained with Dr. Tyrone Sage ,who evaluated the  patient studies and agreed with recommendations to proceed with surgical  revascularization.   PROCEDURE:  The patient, on February 12, 2007, was taken to the  operating room and underwent coronary artery bypass grafting x4.  The  following grafts were placed:  1. Left internal mammary artery to the left anterior descending  coronary artery.  2. A reverse saphenous vein graft to the diagonal.  3. A reverse saphenous vein graft to the first obtuse marginal.  4. Finally, a reverse saphenous vein graft to the posterior descending      coronary artery.   The patient tolerated the procedure well, was taken to the surgical  intensive care unit in stable condition.   POSTOPERATIVE HOSPITAL COURSE:  Overall, the patient has done quite  well.  He has remained neurologically intact.  He was weaned from the  ventilator and inotropic support without difficulty.  He did have a  blood loss anemia.  This did require a transfusion.  Additionally, he  had postoperative atrial fibrillation which has been chemically  cardioverted to a normal sinus rhythm.  He has maintained sinus rhythm  for 2 days without further dysrhythmias or ectopy.  His oxygen has been  weaned and he maintains good saturations on room air.  His diabetes  is  under adequate control.  He has been restarted on his oral diabetic  medication regimen.  The incisions are healing well without signs of  infection.  He has had some moderate volume overload, but has responded  well to a gentle diuresis.  Overall, his status is felt to be quite  stable for discharge on February 17, 2007.   INSTRUCTIONS:  The patient will receive written instructions in regard  to medications, activity, diet, wound care and followup.  Followup will  include Dr. Ty Hilts on February 28, 2007 at 10:50 a.m.  Additionally, he  will see Dr. Tyrone Sage on March 07, 2007 at 12:30 p.m.   MEDICATIONS ON DISCHARGE:  1. Aspirin 325 mg daily.  2. Lopressor 25 mg twice daily.  3. Lipitor 20 mg nightly.  4. Folic acid 1 mg daily.  5. Lasix 40 mg daily for 7 days.  6. KCl 20 mEq daily for 7 days.  7. Amiodarone 400 mg twice daily for 10 days, then once daily.  8. Januvia 100 mg daily.  9. Glimepiride 4 mg 2 times daily.  10.Actos 15 mg daily for pain.  11.Oxycodone 5 mg 1-2 every 4-6 hours as needed.   FINAL DIAGNOSES:  Severe multivessel coronary artery disease as  described, now status post surgical revascularization also as described.   Other diagnoses include postoperative anemia, acute blood loss.  Most  recent hemoglobin and hematocrit dated February 15, 2007 is 9.3 an 27.1.  Other diagnoses include a history of tobacco use, postoperative atrial  fibrillation now in normal sinus rhythm.  Other diagnoses include  hypertension, diabetes mellitus type 2.  Previous surgeries as listed in  the history.      Rowe Clack, P.A.-C.      Sheliah Plane, MD  Electronically Signed    WEG/MEDQ  D:  02/17/2007  T:  02/18/2007  Job:  [id number]   cc:   Sheliah Plane, MD  Francisca December, M.D.

## 2010-09-27 NOTE — Op Note (Signed)
[name], PINSON NO.:  [id number]   MEDICAL RECORD NO.:  [id number]          PATIENT TYPE:  OIB   LOCATION:  5152                         FACILITY:  [facility]   PHYSICIAN:  Beulah Gandy. Ashley Royalty, M.D. DATE OF BIRTH:  1934-09-16   DATE OF PROCEDURE:  06/04/2007  DATE OF DISCHARGE:                               OPERATIVE REPORT   PREOPERATIVE DIAGNOSIS:  Preretinal fibrosis, left eye.   POSTOPERATIVE DIAGNOSIS:   PROCEDURE:  Pars plana vitrectomy with membrane peel, retinal  photocoagulation, left eye.   SURGEON:  Beulah Gandy. Ashley Royalty, M.D.   ASSISTANT:  Rosalie Doctor, M.A.   ANESTHESIA:  General.   DESCRIPTION OF PROCEDURE:  Usual prep and drape.  The indirect  ophthalmoscope laser was moved into place.  609 burns were placed in two  rows around weak areas of the retina in the retinal periphery.  The  power was 400 milliwatts, 1000 microns each and 0.07 seconds each.  Attention was then carried to the pars plana area where conjunctival  peritomies were performed at 10, 2, and 4 o'clock.  Sclerotomies were  performed with a diamond knife in three-layered entry at 10, 2, and 4  o'clock.  The 5-mm infusion port anchored into place at 4 o'clock.  Contact lens ring anchored into place at 6 and 12 o'clock.  Provisc  placed on the corneal surface, and the flat contact lens was placed.  The pars plana vitrectomy was begun just behind the pseudophakos.  Vitreous debris was encountered and carefully removed under low suction  and rapid cutting.  The vitrectomy was carried out in the far peripheral  vitreous base area with a 30-degree prismatic lens.  The vitrectomy was  carried down to the vitreous base for 360 degrees.  Attention was then  carried to the macular region where the magnifying contact lens was  placed.  A diamond dusted membrane scraper was used to grab wisps or to  engage wisps of the preretinal membrane; however, the Eagle 27-gauge  pick was also required to  engage the membrane and peel it from its  attachments to the macular region and the paramacular area.  The ILM  forceps were used to grasp the membrane and peel it from around the  macular region.  The entire foveal region was uncovered, and the  membrane was carefully removed under low suction and rapid cutting.  Additional cutting was performed until all fragments were removed.  The  instruments were removed from the eye, and 9-0 nylon was used to close  the sclerotomy sites.  They were tested and found to be tight.  The  conjunctiva was closed with wet-field cautery.  Polymyxin and gentamicin  were irrigated into tenon's space.  Marcaine was injected around the  globe for postop pain.  Decadron 10 mg was injected into the lower  subconjunctival space.  TobraDex ophthalmic ointment, a patch and shield  were placed.  The closing pressure was 10 with the [facility].  Complications none.  Duration 1 hour.  The patient was awakened and  taken to recovery in satisfactory condition.  Beulah Gandy. Ashley Royalty, M.D.  Electronically Signed     JDM/MEDQ  D:  06/04/2007  T:  06/04/2007  Job:  [id number]

## 2010-09-30 NOTE — Op Note (Signed)
NAME:  [name], [name]                          ACCOUNT NO.:  [id number]   MEDICAL RECORD NO.:  [id number]                   PATIENT TYPE:  OBV   LOCATION:  2550                                 FACILITY:  [facility]   PHYSICIAN:  Gabrielle Dare. Janee Morn, M.D.             DATE OF BIRTH:  Nov 21, 1934   DATE OF PROCEDURE:  11/30/2003  DATE OF DISCHARGE:                                 OPERATIVE REPORT   PREOPERATIVE DIAGNOSIS:  Acute appendicitis.   POSTOPERATIVE DIAGNOSIS:  Acute appendicitis.   OPERATION PERFORMED:  Laparoscopic appendectomy.   SURGEON:  Gabrielle Dare. Janee Morn, M.D.   ANESTHESIA:  General.   INDICATIONS FOR PROCEDURE:  The patient is a 76 year old white male who had  sudden onset of right lower quadrant pain earlier today doing some  exercises. He was evaluated in the emergency department.  History and  physical exam, laboratory studies and CT scan of the abdomen and pelvis were  done and were consistent with acute appendicitis.  He was brought to the  operating room for emergency appendectomy.   DESCRIPTION OF PROCEDURE:  Informed consent was obtained.  The patient  received intravenous antibiotics.  He was brought to the operating room,  general anesthesia was administered.  His abdomen was prepped and draped in  sterile fashion.  Infraumbilical incision was made.  Subcutaneous tissues  were dissected down revealing the anterior fascia which was divided sharply.  The peritoneal cavity was then entered under direct vision without  difficulty.  A 0 Vicryl pursestring suture was placed around the fascial  opening.  The Hasson was inserted into the abdomen.  The abdomen was  insufflated with carbon dioxide in the standard fashion.  Under direct  vision, the 12 mm left lower quadrant and 5 mm right upper quadrant port  were placed.  0.25% Marcaine with epinephrine was used at all port sites.  Exploration revealed an acutely inflamed appendix with some hyperemia at the  proximal  portion.  The base of the appendix was dissected from the  mesoappendix and once a nice window was made, was divided at its exit from  the cecum with the cecum with the endoscopic GIA stapler with a vascular  load.  Once this was accomplished, the mesoappendix was then divided with a  GIA stapler with a vascular load.  The appendix was placed in an EndoCatch  bag and taken out of the abdomen via the left lower quadrant port site.  It  was not perforated but it was acutely inflamed.  The staple lines were then  rechecked.  The mesoappendix staple line was briefly cauterized to get  excellent hemostasis.  The area was copiously irrigated with 2 L of saline.  The irrigant subsequently returned clear.  The fluid was evacuated.  Staples  lines were rechecked and noted to be hemostatic.  The ports were removed  under direct vision after evacuating all the irrigation fluid and  the  pneumoperitoneum was released.  The Hasson trocar was removed.  The  umbilical fascial defect was closed by tying the 0 Vicryl pursestring  suture.  All three wounds were copiously irrigated.  0.25% Marcaine with  epinephrine was again injected for some local pain relief and the skin of  each was closed with running 4-0 Vicryl subcuticular stitch.  Sponge, needle  and instrument counts were correct.  Benzoin and Steri-Strips and sterile  dressings were applied.  The patient tolerated the procedure well without  apparent complication and was taken to the recovery room in stable  condition.                                              Gabrielle Dare Janee Morn, M.D.   BET/MEDQ  D:  11/30/2003  T:  12/01/2003  Job:  [id number]

## 2010-09-30 NOTE — H&P (Signed)
NAME:  [name], [name]                          ACCOUNT NO.:  [id number]   MEDICAL RECORD NO.:  [id number]                   PATIENT TYPE:  OBV   LOCATION:  2550                                 FACILITY:  [facility]   PHYSICIAN:  Gabrielle Dare. Janee Morn, M.D.             DATE OF BIRTH:  [birth year]   DATE OF ADMISSION:  11/30/2003  DATE OF DISCHARGE:                                HISTORY & PHYSICAL   CHIEF COMPLAINT:  Right lower quadrant abdominal pain.   HISTORY OF PRESENT ILLNESS:  The patient is a 76 year old white male who  complains of the sudden onset of right lower quadrant abdominal pain earlier  today after he was doing some exercises.  He had no associated nausea and  vomiting and actually ate lunch but the pain persisted.  He came to [facility] emergency department for evaluation.  He underwent workup including  white blood cell count of 8.7 and he had a CT scan of the abdomen and pelvis  done which showed acute appendicitis.  The patient continued to have some  right lower quadrant pain at this time.  He has no other complaints.   PAST MEDICAL HISTORY:  Hypertension, non insulin dependent diabetes  mellitus.   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   MEDICATIONS:  Amaryl of unknown dose, Norvasc 10 mg p.o. daily, Monopril  unknown dose.   SOCIAL HISTORY:  The patient does not smoke.  He drinks alcohol,  approximately two drinks per day.   ALLERGIES:  No known drug allergies.   REVIEW OF SYMPTOMS:  GENERAL:  Negative.  CARDIOVASCULAR:  He had a stress  test done recently which was negative.  PULMONARY:  No shortness of breath  or other complaints.  GI:  See history of present illness.  GU:  No  complaints.  MUSCULOSKELETAL:  Minimal joint pain.  NEUROLOGICAL:  Negative.  The remainder of review of systems is  negative.   PHYSICAL EXAMINATION:  VITAL SIGNS:  Temperature 98.7, pulse 52,  respirations 18, blood pressure 162/81.  GENERAL:  He is awake,  alert, in no acute distress.  HEENT:  Pupils are equal and reactive, sclerae nonicteric.  NECK:  Supple with no masses.  LUNGS:  Clear to auscultation with normal respiratory excursion.  HEART:  Regular rate and rhythm, PMI palpable along the left chest.  ABDOMEN:  Soft, no masses are noted.  He does have tenderness to palpation  in the right lower quadrant with voluntary guarding.  No other masses or  tenderness is noted, there is no  hepatosplenomegaly.  SKIN:  Warm and dry and intact with no rashes.  EXTREMITIES:  No cyanosis or edema.   LABORATORY DATA:  White blood cell count 8.7, hemoglobin 13.6, platelets  170.  His amylase is unremarkable.  CT scan of the abdomen and pelvis shows  acute appendicitis.   IMPRESSION:  76 year old white male  with acute appendicitis.   PLAN:  Take him to the operating room for laparoscopic appendectomy.  We  will give him intravenous antibiotics preoperatively.  The procedure risks  and benefits were discussed in detail with the patient and his wife and he  was agreeable to proceeding.  In the interim, we will also check a chest x-  ray and EKG.                                                Gabrielle Dare Janee Morn, M.D.    BET/MEDQ  D:  11/30/2003  T:  11/30/2003  Job:  [id number]

## 2011-01-26 ENCOUNTER — Other Ambulatory Visit: Payer: Self-pay | Admitting: Dermatology

## 2011-02-02 LAB — BASIC METABOLIC PANEL
BUN: 22
CO2: 25
Calcium: 9.7
Chloride: 106
GFR calc non Af Amer: 58 — ABNORMAL LOW
Glucose, Bld: 120 — ABNORMAL HIGH

## 2011-02-02 LAB — CBC
HCT: 36.7 — ABNORMAL LOW
Hemoglobin: 12.8 — ABNORMAL LOW
RBC: 4.11 — ABNORMAL LOW

## 2011-02-15 LAB — PROTIME-INR: INR: 1.1

## 2011-02-15 LAB — CBC
MCHC: 35.2
Platelets: 169
RBC: 3.84 — ABNORMAL LOW
RDW: 13
WBC: 5.5

## 2011-02-15 LAB — DIFFERENTIAL
Lymphs Abs: 1.9
Monocytes Relative: 9
Neutro Abs: 2.9
Neutrophils Relative %: 53

## 2011-02-15 LAB — POCT I-STAT, CHEM 8
Chloride: 105
HCT: 35 — ABNORMAL LOW
Potassium: 3.9
Sodium: 140

## 2011-02-15 LAB — APTT: aPTT: 30

## 2011-02-21 LAB — PROTEIN, BODY FLUID: Total protein, fluid: 4.3

## 2011-02-23 LAB — BASIC METABOLIC PANEL
BUN: 15
BUN: 17
BUN: 17
BUN: 18
BUN: 29 — ABNORMAL HIGH
CO2: 25
CO2: 25
CO2: 26
CO2: 27
CO2: 22
Calcium: 7.5 — ABNORMAL LOW
Calcium: 8 — ABNORMAL LOW
Calcium: 8.1 — ABNORMAL LOW
Calcium: 8.1 — ABNORMAL LOW
Calcium: 8.7
Chloride: 107
Chloride: 107
Chloride: 108
Chloride: 111
Chloride: 106
Creatinine, Ser: 1.13
Creatinine, Ser: 1.14
Creatinine, Ser: 1.22
Creatinine, Ser: 1.38
Creatinine, Ser: 1.33
GFR calc Af Amer: >60
GFR calc Af Amer: >60
GFR calc Af Amer: >60
GFR calc Af Amer: >60
GFR calc Af Amer: >60
GFR calc non Af Amer: 51 — ABNORMAL LOW
GFR calc non Af Amer: 59 — ABNORMAL LOW
GFR calc non Af Amer: >60
GFR calc non Af Amer: >60
Glucose, Bld: 129 — ABNORMAL HIGH
Glucose, Bld: 137 — ABNORMAL HIGH
Glucose, Bld: 166 — ABNORMAL HIGH
Glucose, Bld: 185 — ABNORMAL HIGH
Glucose, Bld: 172 — ABNORMAL HIGH
Potassium: 3.5
Potassium: 3.6
Potassium: 3.7
Potassium: 3.8
Sodium: 137
Sodium: 137
Sodium: 140
Sodium: 140

## 2011-02-23 LAB — POCT I-STAT 4, (NA,K, GLUC, HGB,HCT)
Glucose, Bld: 125 — ABNORMAL HIGH
Glucose, Bld: 143 — ABNORMAL HIGH
HCT: 23 — ABNORMAL LOW
HCT: 26 — ABNORMAL LOW
HCT: 32 — ABNORMAL LOW
Hemoglobin: 10.9 — ABNORMAL LOW
Hemoglobin: 8.2 — ABNORMAL LOW
Hemoglobin: 8.8 — ABNORMAL LOW
Hemoglobin: 9.9 — ABNORMAL LOW
Operator id: 3402
Operator id: 3402
Potassium: 3.9
Potassium: 4.5
Potassium: 4.7
Sodium: 138
Sodium: 138
Sodium: 140

## 2011-02-23 LAB — BLOOD GAS, ARTERIAL
Bicarbonate: 21.3
Drawn by: 281201
O2 Content: 21
O2 Saturation: 96
Patient temperature: 96.7
pH, Arterial: 7.414
pO2, Arterial: 75.9 — ABNORMAL LOW

## 2011-02-23 LAB — POCT I-STAT 3, ART BLOOD GAS (G3+)
Acid-base deficit: 3 — ABNORMAL HIGH
Bicarbonate: 23.4
O2 Saturation: 100
O2 Saturation: 96
Operator id: 274841
Operator id: 274841
Operator id: 3402
TCO2: 24
TCO2: 25
TCO2: 26
pCO2 arterial: 34 — ABNORMAL LOW
pCO2 arterial: 36.9
pH, Arterial: 7.314 — ABNORMAL LOW
pH, Arterial: 7.402
pH, Arterial: 7.493 — ABNORMAL HIGH
pO2, Arterial: 343 — ABNORMAL HIGH
pO2, Arterial: 84

## 2011-02-23 LAB — DIFFERENTIAL
Eosinophils Absolute: 0.1
Eosinophils Relative: 2
Eosinophils Relative: 1
Lymphocytes Relative: 44
Lymphocytes Relative: 34
Lymphs Abs: 2.3
Lymphs Abs: 1.9
Monocytes Relative: 9
Neutro Abs: 3.1

## 2011-02-23 LAB — CBC
HCT: 19.9 — ABNORMAL LOW
HCT: 22.2 — ABNORMAL LOW
HCT: 25.2 — ABNORMAL LOW
HCT: 27.1 — ABNORMAL LOW
HCT: 22.9 — ABNORMAL LOW
HCT: 23.2 — ABNORMAL LOW
Hemoglobin: 7.1 — CL
Hemoglobin: 7.9 — CL
Hemoglobin: 8.9 — ABNORMAL LOW
Hemoglobin: 9.3 — ABNORMAL LOW
Hemoglobin: 8.1 — ABNORMAL LOW
Hemoglobin: 8.2 — ABNORMAL LOW
MCHC: 34.3
MCHC: 35.4
MCHC: 35.4
MCHC: 35.6
MCHC: 35.4
MCHC: 35.5
MCHC: 35.9
MCV: 89.7
MCV: 90
MCV: 90.6
MCV: 91.8
MCV: 89.7
MCV: 90.1
MCV: 90.4
MCV: 89.8
Platelets: 118 — ABNORMAL LOW
Platelets: 128 — ABNORMAL LOW
Platelets: 89 — ABNORMAL LOW
Platelets: 91 — ABNORMAL LOW
Platelets: 104 — ABNORMAL LOW
Platelets: 96 — ABNORMAL LOW
Platelets: 171
RBC: 2.22 — ABNORMAL LOW
RBC: 2.45 — ABNORMAL LOW
RBC: 2.81 — ABNORMAL LOW
RBC: 2.95 — ABNORMAL LOW
RBC: 2.54 — ABNORMAL LOW
RBC: 2.57 — ABNORMAL LOW
RDW: 12.7
RDW: 13
RDW: 13.5
RDW: 13.5
RDW: 12.5
RDW: 12.7
WBC: 5.5
WBC: 6.1
WBC: 6.9
WBC: 7.1
WBC: 6
WBC: 6.2

## 2011-02-23 LAB — URINALYSIS, ROUTINE W REFLEX MICROSCOPIC
Bilirubin Urine: NEGATIVE
Glucose, UA: NEGATIVE
Hgb urine dipstick: NEGATIVE
Ketones, ur: NEGATIVE
Nitrite: NEGATIVE
Protein, ur: NEGATIVE
Specific Gravity, Urine: 1.015
Urobilinogen, UA: 0.2
pH: 6

## 2011-02-23 LAB — POCT CARDIAC MARKERS
CKMB, poc: <1 — ABNORMAL LOW
CKMB, poc: <1 — ABNORMAL LOW
Myoglobin, poc: 65.4
Operator id: 1192
Operator id: 4531
Troponin i, poc: 0.23 — ABNORMAL HIGH
Troponin i, poc: 0.25 — ABNORMAL HIGH
Troponin i, poc: 0.25 — ABNORMAL HIGH

## 2011-02-23 LAB — PROTIME-INR
INR: 1
INR: 1.5
Prothrombin Time: 18.5 — ABNORMAL HIGH

## 2011-02-23 LAB — CK TOTAL AND CKMB (NOT AT ARMC)
CK, MB: 11.6 — ABNORMAL HIGH
CK, MB: 1.2
CK, MB: 1.2
CK, MB: 1.6
Relative Index: 6.4 — ABNORMAL HIGH
Relative Index: INVALID
Relative Index: INVALID
Relative Index: INVALID
Relative Index: INVALID
Total CK: 181
Total CK: 46
Total CK: 60

## 2011-02-23 LAB — I-STAT EC8
Acid-base deficit: 5 — ABNORMAL HIGH
BUN: 21
Bicarbonate: 23.1
Glucose, Bld: 147 — ABNORMAL HIGH
HCT: 20 — ABNORMAL LOW
HCT: 21 — ABNORMAL LOW
Hemoglobin: 7.1 — CL
Operator id: 277261
Potassium: 4.4
Sodium: 140
TCO2: 21
pCO2 arterial: 38.9
pH, Arterial: 7.383

## 2011-02-23 LAB — COMPREHENSIVE METABOLIC PANEL
AST: 20
Albumin: 3.9
BUN: 21
BUN: 23
CO2: 25
CO2: 21
Calcium: 9.2
Calcium: 9
Creatinine, Ser: 1.34
Creatinine, Ser: 1.15
GFR calc Af Amer: 56 — ABNORMAL LOW
GFR calc non Af Amer: 53 — ABNORMAL LOW
GFR calc non Af Amer: 47 — ABNORMAL LOW
Glucose, Bld: 128 — ABNORMAL HIGH
Sodium: 137
Total Protein: 6.9

## 2011-02-23 LAB — HEMOGLOBIN A1C
Hgb A1c MFr Bld: 6
Mean Plasma Glucose: 140

## 2011-02-23 LAB — TYPE AND SCREEN
ABO/RH(D): O POS
Antibody Screen: NEGATIVE

## 2011-02-23 LAB — APTT: aPTT: 35

## 2011-02-23 LAB — MAGNESIUM
Magnesium: 2.2
Magnesium: 2.3
Magnesium: 2.3

## 2011-02-23 LAB — CREATININE, SERUM
Creatinine, Ser: 1.2
Creatinine, Ser: 1.17
GFR calc Af Amer: >60
GFR calc Af Amer: >60
GFR calc non Af Amer: 60 — ABNORMAL LOW
GFR calc non Af Amer: >60

## 2011-02-23 LAB — TROPONIN I
Troponin I: 0.26 — ABNORMAL HIGH
Troponin I: 0.38 — ABNORMAL HIGH

## 2011-02-23 LAB — LIPID PANEL
Triglycerides: 263 — ABNORMAL HIGH
VLDL: 53 — ABNORMAL HIGH

## 2011-02-23 LAB — HEMOGLOBIN AND HEMATOCRIT, BLOOD
HCT: 26.5 — ABNORMAL LOW
Hemoglobin: 9.5 — ABNORMAL LOW

## 2011-03-08 ENCOUNTER — Ambulatory Visit: Payer: Self-pay

## 2011-03-09 ENCOUNTER — Encounter (INDEPENDENT_AMBULATORY_CARE_PROVIDER_SITE_OTHER): Payer: Medicare Other | Admitting: Ophthalmology

## 2011-03-09 DIAGNOSIS — H43819 Vitreous degeneration, unspecified eye: Secondary | ICD-10-CM

## 2011-03-09 DIAGNOSIS — H33009 Unspecified retinal detachment with retinal break, unspecified eye: Secondary | ICD-10-CM

## 2011-03-09 DIAGNOSIS — E11319 Type 2 diabetes mellitus with unspecified diabetic retinopathy without macular edema: Secondary | ICD-10-CM

## 2011-03-09 DIAGNOSIS — H2510 Age-related nuclear cataract, unspecified eye: Secondary | ICD-10-CM

## 2011-07-18 ENCOUNTER — Other Ambulatory Visit: Payer: Self-pay | Admitting: Dermatology

## 2012-02-13 ENCOUNTER — Other Ambulatory Visit: Payer: Self-pay | Admitting: Dermatology

## 2012-03-11 ENCOUNTER — Encounter (INDEPENDENT_AMBULATORY_CARE_PROVIDER_SITE_OTHER): Payer: Medicare Other | Admitting: Ophthalmology

## 2012-03-11 DIAGNOSIS — E11319 Type 2 diabetes mellitus with unspecified diabetic retinopathy without macular edema: Secondary | ICD-10-CM

## 2012-03-11 DIAGNOSIS — H43819 Vitreous degeneration, unspecified eye: Secondary | ICD-10-CM

## 2012-03-11 DIAGNOSIS — H2510 Age-related nuclear cataract, unspecified eye: Secondary | ICD-10-CM

## 2012-03-11 DIAGNOSIS — E1165 Type 2 diabetes mellitus with hyperglycemia: Secondary | ICD-10-CM

## 2012-03-11 DIAGNOSIS — E1139 Type 2 diabetes mellitus with other diabetic ophthalmic complication: Secondary | ICD-10-CM

## 2012-03-11 DIAGNOSIS — H33009 Unspecified retinal detachment with retinal break, unspecified eye: Secondary | ICD-10-CM

## 2012-10-24 ENCOUNTER — Other Ambulatory Visit: Payer: Self-pay | Admitting: Gastroenterology

## 2013-03-11 ENCOUNTER — Ambulatory Visit (INDEPENDENT_AMBULATORY_CARE_PROVIDER_SITE_OTHER): Payer: Medicare Other | Admitting: Ophthalmology

## 2013-03-11 DIAGNOSIS — H35039 Hypertensive retinopathy, unspecified eye: Secondary | ICD-10-CM

## 2013-03-11 DIAGNOSIS — E11319 Type 2 diabetes mellitus with unspecified diabetic retinopathy without macular edema: Secondary | ICD-10-CM

## 2013-03-11 DIAGNOSIS — H43819 Vitreous degeneration, unspecified eye: Secondary | ICD-10-CM

## 2013-03-11 DIAGNOSIS — E1139 Type 2 diabetes mellitus with other diabetic ophthalmic complication: Secondary | ICD-10-CM

## 2013-03-11 DIAGNOSIS — I10 Essential (primary) hypertension: Secondary | ICD-10-CM

## 2013-03-11 DIAGNOSIS — H33009 Unspecified retinal detachment with retinal break, unspecified eye: Secondary | ICD-10-CM

## 2013-03-11 DIAGNOSIS — H2510 Age-related nuclear cataract, unspecified eye: Secondary | ICD-10-CM

## 2013-09-21 ENCOUNTER — Encounter: Payer: Self-pay | Admitting: *Deleted

## 2013-11-25 ENCOUNTER — Encounter: Payer: Self-pay | Admitting: Interventional Cardiology

## 2013-12-18 ENCOUNTER — Ambulatory Visit: Payer: Medicare Other | Admitting: Interventional Cardiology

## 2014-01-06 ENCOUNTER — Encounter: Payer: Self-pay | Admitting: Cardiology

## 2014-01-06 ENCOUNTER — Ambulatory Visit (INDEPENDENT_AMBULATORY_CARE_PROVIDER_SITE_OTHER): Payer: Medicare Other | Admitting: Interventional Cardiology

## 2014-01-06 VITALS — BP 130/74 | HR 53 | Ht 68.0 in | Wt 177.0 lb

## 2014-01-06 DIAGNOSIS — I2510 Atherosclerotic heart disease of native coronary artery without angina pectoris: Secondary | ICD-10-CM

## 2014-01-06 DIAGNOSIS — I10 Essential (primary) hypertension: Secondary | ICD-10-CM

## 2014-01-06 DIAGNOSIS — I4891 Unspecified atrial fibrillation: Secondary | ICD-10-CM | POA: Insufficient documentation

## 2014-01-06 DIAGNOSIS — E782 Mixed hyperlipidemia: Secondary | ICD-10-CM

## 2014-01-06 HISTORY — DX: Essential (primary) hypertension: I10

## 2014-01-06 MED ORDER — SIMVASTATIN 20 MG PO TABS: 20.0000 mg | ORAL_TABLET | Freq: Every day | ORAL | Status: DC

## 2014-01-06 MED ORDER — SIMVASTATIN 20 MG PO TABS
20.0000 mg | ORAL_TABLET | Freq: Every day | ORAL | Status: DC
Start: 2014-01-06 — End: 2015-05-17

## 2014-01-06 NOTE — Patient Instructions (Signed)
Your physician has recommended you make the following change in your medication:   1. Stop Crestor.  2. Start Simvastatin 20 mg 1 tablet daily.   Your physician recommends that you return for a FASTING lipid and hepatic panel on 04/13/14.   Your physician wants you to follow-up in: 1 year with Dr. Irish Lack. You will receive a reminder letter in the mail two months in advance. If you don't receive a letter, please call our office to schedule the follow-up appointment.

## 2014-01-06 NOTE — Progress Notes (Signed)
Patient ID: [name], male   DOB: 04/24/1935, 79 y.o.   MRN: [id number]    [address], [place], [place]  [address] Phone: [phone] Fax:  [phone]  Date:  01/06/2014   ID:  [name], DOB [birth year], MRN [id number]  PCP:  No primary provider on file.      History of Present Illness: [name] is a 79 y.o. male who had CABG in 2008. He had a chest pressure at rest at that time. He did not recall any exertional symptoms.  Crestor getting expensive. CAD/ASCVD:  Not using machines at gym as much. No angina. Walks a little at work. c/o Dizziness while getting up from sitting position.  Denies : Chest pain No problems with exercise at the [facility].Marland Kitchen  Diaphoresis.  Leg edema.  Nitroglycerin.  Orthopnea.  Palpitations.  Syncope.     Wt Readings from Last 3 Encounters:  01/06/14 177 lb (80.287 kg)     Past Medical History  Diagnosis Date  . Diabetes mellitus without complication   . Hypertension   . Hyperlipidemia   . Spinal stenosis   . Right bundle branch block   . Adenomatous polyp   . Bell's palsy   . CAD (coronary artery disease)   . Renal retinopathy, stage 3 (moderate)   . Melanoma   . Essential hypertension, benign 01/06/2014    Current Outpatient Prescriptions  Medication Sig Dispense Refill  . amLODipine (NORVASC) 5 MG tablet Take 5 mg by mouth daily.      Marland Kitchen aspirin 81 MG tablet Take 81 mg by mouth daily.      . Cholecalciferol (VITAMIN D) 2000 UNITS tablet Take 2,000 Units by mouth daily.      . Coenzyme Q10 (CO Q-10) 50 MG CAPS Take 1 tablet by mouth daily.       Marland Kitchen glimepiride (AMARYL) 4 MG tablet Take 4 mg by mouth daily with breakfast.      . Multiple Vitamin (MULTIVITAMIN) capsule Take 1 capsule by mouth daily.      . nitroGLYCERIN (NITROSTAT) 0.4 MG SL tablet Place 0.4 mg under the tongue every 5 (five) minutes as needed for chest pain.      . rosuvastatin (CRESTOR) 5 MG tablet Take 5 mg by mouth daily.       No current  facility-administered medications for this visit.    Allergies:    Allergies  Allergen Reactions  . Lipitor [Atorvastatin]   . Metformin And Related     Social History:  The patient  reports that he quit smoking about 35 years ago. He does not have any smokeless tobacco history on file. He reports that he drinks alcohol. He reports that he does not use illicit drugs.   Family History:  The patient's family history includes CAD in his father and sister; COPD in his mother and sister; Emphysema in his mother and sister; Heart attack in his father.   ROS:  Please see the history of present illness.  No nausea, vomiting.  No fevers, chills.  No focal weakness.  No dysuria. 4  All other systems reviewed and negative.   PHYSICAL EXAM: VS:  BP 130/74  Pulse 53  Ht 5\' 8"  (1.727 m)  Wt 177 lb (80.287 kg)  BMI 26.92 kg/m2 Well nourished, well developed, in no acute distress HEENT: normal Neck: no JVD, no carotid bruits Cardiac:  normal S1, S2; RRR;  Lungs:  clear to auscultation bilaterally, no wheezing,  rhonchi or rales Abd: soft, nontender, no hepatomegaly Ext: no edema Skin: warm and dry Neuro:   no focal abnormalities noted  EKG:  SB, RBBB     ASSESSMENT AND PLAN:  CAD  Stop Crestor Tablet, 5 MG, 1 tablet, Orally, Once a day Continue Aspirin Tablet Delayed Release, 81 MG, 1 tablet, Orally, once a day Start Nitroglycerin 0.4 mg tablet, 0.4 mg, 1 tablet as directed, SL, as directed prn chest pain, 1 vial Notes: No angina. Continue aggressive secondary prevention. He will let us know if he has any symptoms similar to what he had prior to his bypass surgery.    2. Atrial fibrillation  IMAGING: EKG    Stegall,Amy 12/17/2012 08:54:44 AM > Inella Kuwahara,JAY 12/17/2012 09:28:09 AM > NSR, RBBB, LAD   Notes: Post op CABG AFib. Apparently has not been a recent issue. bradycardia. difficult to get heart rate up with exercise.    3. Hypercholesterolemia, Mixed  Continue Fish Oil Capsule,  1250MG ( calamarine oil), 2 tablets, Orally, Daily Continue Co Q-10 Capsule, 50 MG, 2 tablets, Orally, Once a day Notes: LDL in the 40s in , HDL in the 30s 4/12. Discussed trial of HDL raising medication. He is not interested at this time. he asked several questions about diet. All of his questions were answered. Start simvastatin 20 mg daily.  Check statin panel in 3 months.    4. Benign hypertension  Continue Amlodipine Besylate Tablet, 5 MG, 1 tablet, Orally, Once a day Notes: COntrolled.    Preventive Medicine  Adult topics discussed:  Exercise: 5 days a week, at least 30 minutes of aerobic exercise.      Signed, Mina Marble, MD, [facility] 01/06/2014 3:28 PM

## 2014-03-16 ENCOUNTER — Ambulatory Visit (INDEPENDENT_AMBULATORY_CARE_PROVIDER_SITE_OTHER): Payer: Medicare Other | Admitting: Ophthalmology

## 2014-03-18 ENCOUNTER — Other Ambulatory Visit: Payer: Self-pay | Admitting: Geriatric Medicine

## 2014-03-18 DIAGNOSIS — Z139 Encounter for screening, unspecified: Secondary | ICD-10-CM

## 2014-03-25 ENCOUNTER — Ambulatory Visit (INDEPENDENT_AMBULATORY_CARE_PROVIDER_SITE_OTHER): Payer: Medicare Other | Admitting: Ophthalmology

## 2014-03-25 DIAGNOSIS — H35033 Hypertensive retinopathy, bilateral: Secondary | ICD-10-CM

## 2014-03-25 DIAGNOSIS — H33302 Unspecified retinal break, left eye: Secondary | ICD-10-CM

## 2014-03-25 DIAGNOSIS — I10 Essential (primary) hypertension: Secondary | ICD-10-CM

## 2014-03-25 DIAGNOSIS — E11329 Type 2 diabetes mellitus with mild nonproliferative diabetic retinopathy without macular edema: Secondary | ICD-10-CM

## 2014-03-25 DIAGNOSIS — H43813 Vitreous degeneration, bilateral: Secondary | ICD-10-CM

## 2014-03-25 DIAGNOSIS — E11319 Type 2 diabetes mellitus with unspecified diabetic retinopathy without macular edema: Secondary | ICD-10-CM

## 2014-03-26 ENCOUNTER — Ambulatory Visit
Admission: RE | Admit: 2014-03-26 | Discharge: 2014-03-26 | Disposition: A | Discharge status: Home / Self Care | Payer: Medicare Other | Source: Ambulatory Visit | Attending: Geriatric Medicine | Admitting: Geriatric Medicine

## 2014-03-26 DIAGNOSIS — Z139 Encounter for screening, unspecified: Secondary | ICD-10-CM

## 2014-04-13 ENCOUNTER — Other Ambulatory Visit (INDEPENDENT_AMBULATORY_CARE_PROVIDER_SITE_OTHER): Payer: Medicare Other | Admitting: *Deleted

## 2014-04-13 DIAGNOSIS — I2510 Atherosclerotic heart disease of native coronary artery without angina pectoris: Secondary | ICD-10-CM

## 2014-04-13 DIAGNOSIS — E782 Mixed hyperlipidemia: Secondary | ICD-10-CM

## 2014-04-13 LAB — LIPID PANEL
CHOLESTEROL: 201 mg/dL — AB (ref 0–200)
HDL: 29 mg/dL — AB (ref >39.00)
NonHDL: 172
TRIGLYCERIDES: 265 mg/dL — AB (ref 0.0–149.0)
Total CHOL/HDL Ratio: 7
VLDL: 53 mg/dL — ABNORMAL HIGH (ref 0.0–40.0)

## 2014-04-13 LAB — HEPATIC FUNCTION PANEL
ALT: 21 U/L (ref 0–53)
AST: 23 U/L (ref 0–37)
Albumin: 4.2 g/dL (ref 3.5–5.2)
Alkaline Phosphatase: 55 U/L (ref 39–117)
Bilirubin, Direct: 0.1 mg/dL (ref 0.0–0.3)
Total Bilirubin: 0.9 mg/dL (ref 0.2–1.2)
Total Protein: 7.1 g/dL (ref 6.0–8.3)

## 2014-04-13 LAB — LDL CHOLESTEROL, DIRECT: Direct LDL: 106.6 mg/dL

## 2014-04-17 ENCOUNTER — Telehealth: Payer: Self-pay | Admitting: Interventional Cardiology

## 2014-04-17 DIAGNOSIS — E785 Hyperlipidemia, unspecified: Secondary | ICD-10-CM

## 2014-04-17 NOTE — Telephone Encounter (Signed)
New Msg  Pt Returning call about lab results. Please contact at [phone].

## 2014-04-17 NOTE — Telephone Encounter (Signed)
The patient is aware of his lipid results. He has only been on Simvastatin for a few weeks since he completed his mail order RX for Crestor. He will take simvastatin 20 mg two tablets daily to see how he tolerates the increase. If he does well with this, he will let us know when he gets closer to needing a refill and we can send this to [facility] for him. He will have a repeat lipid/ liver 07/17/14.

## 2014-07-14 ENCOUNTER — Other Ambulatory Visit: Payer: Self-pay | Admitting: Dermatology

## 2014-07-17 ENCOUNTER — Other Ambulatory Visit (INDEPENDENT_AMBULATORY_CARE_PROVIDER_SITE_OTHER): Payer: Medicare Other | Admitting: *Deleted

## 2014-07-17 DIAGNOSIS — E785 Hyperlipidemia, unspecified: Secondary | ICD-10-CM

## 2014-07-17 LAB — HEPATIC FUNCTION PANEL
ALK PHOS: 51 U/L (ref 39–117)
ALT: 17 U/L (ref 0–53)
AST: 22 U/L (ref 0–37)
Albumin: 4.4 g/dL (ref 3.5–5.2)
BILIRUBIN DIRECT: 0.1 mg/dL (ref 0.0–0.3)
BILIRUBIN TOTAL: 0.8 mg/dL (ref 0.2–1.2)
Total Protein: 7.2 g/dL (ref 6.0–8.3)

## 2014-07-17 LAB — LIPID PANEL
Cholesterol: 159 mg/dL (ref 0–200)
HDL: 37.5 mg/dL — ABNORMAL LOW
LDL Cholesterol: 99 mg/dL (ref 0–99)
NonHDL: 121.5
Total CHOL/HDL Ratio: 4
Triglycerides: 114 mg/dL (ref 0.0–149.0)
VLDL: 22.8 mg/dL (ref 0.0–40.0)

## 2014-11-09 ENCOUNTER — Other Ambulatory Visit: Payer: Self-pay

## 2015-01-07 ENCOUNTER — Encounter: Payer: Self-pay | Admitting: Interventional Cardiology

## 2015-03-26 ENCOUNTER — Ambulatory Visit (INDEPENDENT_AMBULATORY_CARE_PROVIDER_SITE_OTHER): Payer: Medicare Other | Admitting: Ophthalmology

## 2015-03-26 DIAGNOSIS — I10 Essential (primary) hypertension: Secondary | ICD-10-CM

## 2015-03-26 DIAGNOSIS — H35033 Hypertensive retinopathy, bilateral: Secondary | ICD-10-CM

## 2015-03-26 DIAGNOSIS — E113391 Type 2 diabetes mellitus with moderate nonproliferative diabetic retinopathy without macular edema, right eye: Secondary | ICD-10-CM

## 2015-03-26 DIAGNOSIS — E113292 Type 2 diabetes mellitus with mild nonproliferative diabetic retinopathy without macular edema, left eye: Secondary | ICD-10-CM | POA: Diagnosis not present

## 2015-03-26 DIAGNOSIS — H43813 Vitreous degeneration, bilateral: Secondary | ICD-10-CM | POA: Diagnosis not present

## 2015-03-26 DIAGNOSIS — E11319 Type 2 diabetes mellitus with unspecified diabetic retinopathy without macular edema: Secondary | ICD-10-CM | POA: Diagnosis not present

## 2015-04-14 NOTE — Progress Notes (Signed)
Patient ID: [name], male   DOB: 1934/08/30, 80 y.o.   MRN: [id number]     Cardiology Office Note   Date:  04/15/2015   ID:  [name], DOB 1934-07-02, MRN [id number]  PCP:  Mathews Argyle, MD    No chief complaint on file. f/u CAD   Wt Readings from Last 3 Encounters:  04/15/15 172 lb (78.019 kg)  01/06/14 177 lb (80.287 kg)       History of Present Illness: [name] is a 80 y.o. male   who had CABG in 2008, at [facility]. He had a chest pressure at rest at that time. He did not recall any exertional symptoms. Crestor switched to Zocor due to expense and perceived side effects.  Ultimately, this was stopped.  He "does not trust" the statins.  He is more afraid of statins and prefers to take red yeast rice.   CAD/ASCVD:  Not using machines at gym- no longer goes to the gym. No angina. Walks a little at work. c/o Dizziness while getting up from sitting position.  Denies : Chest pain.   No longerexercises at the [facility].  He is trying to do balance exercises.  Diaphoresis.  Leg edema.  Nitroglycerin.  Orthopnea.  Palpitations.  Syncope.   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 Overall, he fels like his stamina is less than last year, due to less activity.   Past Medical History  Diagnosis Date  . Diabetes mellitus without complication ([place])   . Hypertension   . Hyperlipidemia   . Spinal stenosis   . Right bundle branch block   . Adenomatous polyp   . Bell's palsy   . CAD (coronary artery disease)   . Renal retinopathy, stage 3 (moderate)   . Melanoma ([place])   . Essential hypertension, benign 01/06/2014    Past Surgical History  Procedure Laterality Date  . Coronary artery bypass graft      4 vessel CABG left internal mammary to LAD, saphenous vein graft x4-diagonal-first obtuse marginal-posterior descending artery 02/11/07  . Left cataract surgery    . Laparoscopic appendectomy      Dr. Grandville Silos July 2005  . Tonsillectomy  and adenoidectomy    . Mastectomy    . Melanoma excision      left jaw line 2009-resected     Current Outpatient Prescriptions  Medication Sig Dispense Refill  . amLODipine (NORVASC) 5 MG tablet Take 5 mg by mouth daily.    Marland Kitchen aspirin 81 MG tablet Take 81 mg by mouth daily.    . Cholecalciferol (VITAMIN D) 2000 UNITS tablet Take 2,000 Units by mouth daily.    . Coenzyme Q10 (CO Q-10) 50 MG CAPS Take 2 tablets by mouth daily. 100 MG TOTAL    . glimepiride (AMARYL) 4 MG tablet Take 4 mg by mouth daily with breakfast.    . Multiple Vitamin (MULTIVITAMIN) capsule Take 1 capsule by mouth daily.    . nitroGLYCERIN (NITROSTAT) 0.4 MG SL tablet Place 0.4 mg under the tongue every 5 (five) minutes as needed for chest pain.    . NON FORMULARY Take 2 capsules by mouth daily. CALAMARINE OIL 2500  MG    . Omega-3 Fatty Acids (FISH OIL DOUBLE STRENGTH) 1200 MG CAPS Take 2 capsules by mouth daily. 2400 MG TOTAL    . Red Yeast Rice Extract 600 MG CAPS Take 2 capsules by mouth.    . simvastatin (ZOCOR) 20 MG  tablet Take 1 tablet (20 mg total) by mouth at bedtime. 90 tablet 3   No current facility-administered medications for this visit.    Allergies:   Lipitor and Metformin and related    Social History:  The patient  reports that he quit smoking about 36 years ago. He does not have any smokeless tobacco history on file. He reports that he drinks alcohol. He reports that he does not use illicit drugs.   Family History:  The patient's family history includes CAD in his father and sister; COPD in his mother and sister; Emphysema in his mother and sister; Heart attack in his father. There is no history of Stroke.    ROS:  Please see the history of present illness.   Otherwise, review of systems are positive for decreased stamina.   All other systems are reviewed and negative.    PHYSICAL EXAM: VS:  BP 124/74 mmHg  Pulse 57  Ht 5\' 8"  (1.727 m)  Wt 172 lb (78.019 kg)  BMI 26.16 kg/m2 , BMI Body mass  index is 26.16 kg/(m^2). GEN: Well nourished, well developed, in no acute distress HEENT: normal Neck: no JVD, carotid bruits, or masses Cardiac: RRR; no murmurs, rubs, or gallops,no edema  Respiratory:  clear to auscultation bilaterally, normal work of breathing GI: soft, nontender, nondistended, + BS MS: no deformity or atrophy Skin: warm and dry, no rash Neuro:  Strength and sensation are intact Psych: euthymic mood, full affect   EKG:   The ekg ordered today demonstrates NSR, RBBB, LAFB, bifascicular   Recent Labs: 07/17/2014: ALT 17   Lipid Panel    Component Value Date/Time   CHOL 159 07/17/2014 0825   TRIG 114.0 07/17/2014 0825   HDL 37.50* 07/17/2014 0825   CHOLHDL 4 07/17/2014 0825   VLDL 22.8 07/17/2014 0825   LDLCALC 99 07/17/2014 0825   LDLDIRECT 106.6 04/13/2014 1012     Other studies Reviewed: Additional studies/ records that were reviewed today with results demonstrating: reviewed cath from 2008.  LVEF 65%..   ASSESSMENT AND PLAN:  1. CAD: Continue Aspirin Tablet Delayed Release, 81 MG, 1 tablet, Orally, once a day Start Nitroglycerin 0.4 mg tablet, 0.4 mg, 1 tablet as directed, SL, as directed prn chest pain, 1 vial Notes: No angina. Continue aggressive secondary prevention. He will let us know if he has any symptoms similar to what he had prior to his bypass surgery.  2. AFib/bradycardia: AFib at the time of CABG.  Has had bradycardia more recently.  No sx of severe bradycardia noted. 3. Hyperlipidemia:  Continue Co Q-10 Capsule, 50 MG, 2 tablets, Orally, Once a day Notes: LDL in the 40s in , HDL in the 30s 4/12. Off of statins.  I don't get the sense that he is willing to retry as noted above.  ontinue red yeast rice.  Labs followed by Dr. Felipa Eth. LDL was 99 in 3/16. 4. HTN:  Continue Amlodipine Besylate Tablet, 5 MG, 1 tablet, Orally, Once a day Notes: COntrolled.  Consider ACE-I given diabetes.  Will defer to PMD. 5. DM: Managed by PMD.  He needs  to increase exercise.    Current medicines are reviewed at length with the patient today.  The patient concerns regarding his medicines were addressed.  The following changes have been made:  No change  Labs/ tests ordered today include:  No orders of the defined types were placed in this encounter.    Recommend 150 minutes/week of aerobic exercise Low fat, low  carb, high fiber diet recommended  Disposition:   FU in 1 year   Teresita Madura., MD  04/15/2015 9:02 AM    [facility] [address], [place], [place]  [address] Phone: [phone]; Fax: [phone]

## 2015-04-15 ENCOUNTER — Encounter: Payer: Self-pay | Admitting: Interventional Cardiology

## 2015-04-15 ENCOUNTER — Ambulatory Visit (INDEPENDENT_AMBULATORY_CARE_PROVIDER_SITE_OTHER): Payer: Medicare Other | Admitting: Interventional Cardiology

## 2015-04-15 VITALS — BP 124/74 | HR 57 | Ht 68.0 in | Wt 172.0 lb

## 2015-04-15 DIAGNOSIS — I2510 Atherosclerotic heart disease of native coronary artery without angina pectoris: Secondary | ICD-10-CM

## 2015-04-15 DIAGNOSIS — I10 Essential (primary) hypertension: Secondary | ICD-10-CM | POA: Diagnosis not present

## 2015-04-15 DIAGNOSIS — E119 Type 2 diabetes mellitus without complications: Secondary | ICD-10-CM | POA: Insufficient documentation

## 2015-04-15 DIAGNOSIS — I480 Paroxysmal atrial fibrillation: Secondary | ICD-10-CM | POA: Diagnosis not present

## 2015-04-15 DIAGNOSIS — E782 Mixed hyperlipidemia: Secondary | ICD-10-CM | POA: Diagnosis not present

## 2015-04-15 DIAGNOSIS — E1159 Type 2 diabetes mellitus with other circulatory complications: Secondary | ICD-10-CM

## 2015-04-15 NOTE — Patient Instructions (Signed)
**Note De-identified Deontrae Drinkard Obfuscation** Medication Instructions:  Same-no changes  Labwork: None  Testing/Procedures: None  Follow-Up: Your physician wants you to follow-up in: 1 year. You will receive a reminder letter in the mail two months in advance. If you don't receive a letter, please call our office to schedule the follow-up appointment.      If you need a refill on your cardiac medications before your next appointment, please call your pharmacy.   

## 2015-05-17 ENCOUNTER — Encounter (HOSPITAL_COMMUNITY): Payer: Self-pay | Admitting: Emergency Medicine

## 2015-05-17 ENCOUNTER — Emergency Department (HOSPITAL_COMMUNITY): Payer: Medicare Other

## 2015-05-17 ENCOUNTER — Emergency Department (HOSPITAL_COMMUNITY)
Admission: EM | Admit: 2015-05-17 | Discharge: 2015-05-17 | Disposition: A | Discharge status: Home / Self Care | Payer: Medicare Other | Attending: Emergency Medicine | Admitting: Emergency Medicine

## 2015-05-17 DIAGNOSIS — Z79899 Other long term (current) drug therapy: Secondary | ICD-10-CM | POA: Insufficient documentation

## 2015-05-17 DIAGNOSIS — Y9389 Activity, other specified: Secondary | ICD-10-CM | POA: Diagnosis not present

## 2015-05-17 DIAGNOSIS — E785 Hyperlipidemia, unspecified: Secondary | ICD-10-CM | POA: Diagnosis not present

## 2015-05-17 DIAGNOSIS — Z85820 Personal history of malignant melanoma of skin: Secondary | ICD-10-CM | POA: Diagnosis not present

## 2015-05-17 DIAGNOSIS — Z87891 Personal history of nicotine dependence: Secondary | ICD-10-CM | POA: Diagnosis not present

## 2015-05-17 DIAGNOSIS — S0990XA Unspecified injury of head, initial encounter: Secondary | ICD-10-CM

## 2015-05-17 DIAGNOSIS — Z87448 Personal history of other diseases of urinary system: Secondary | ICD-10-CM | POA: Insufficient documentation

## 2015-05-17 DIAGNOSIS — Z8739 Personal history of other diseases of the musculoskeletal system and connective tissue: Secondary | ICD-10-CM | POA: Diagnosis not present

## 2015-05-17 DIAGNOSIS — Z7984 Long term (current) use of oral hypoglycemic drugs: Secondary | ICD-10-CM | POA: Diagnosis not present

## 2015-05-17 DIAGNOSIS — W01190A Fall on same level from slipping, tripping and stumbling with subsequent striking against furniture, initial encounter: Secondary | ICD-10-CM | POA: Diagnosis not present

## 2015-05-17 DIAGNOSIS — Y9289 Other specified places as the place of occurrence of the external cause: Secondary | ICD-10-CM | POA: Diagnosis not present

## 2015-05-17 DIAGNOSIS — Z7982 Long term (current) use of aspirin: Secondary | ICD-10-CM | POA: Diagnosis not present

## 2015-05-17 DIAGNOSIS — Z8669 Personal history of other diseases of the nervous system and sense organs: Secondary | ICD-10-CM | POA: Diagnosis not present

## 2015-05-17 DIAGNOSIS — Z86018 Personal history of other benign neoplasm: Secondary | ICD-10-CM | POA: Insufficient documentation

## 2015-05-17 DIAGNOSIS — Y998 Other external cause status: Secondary | ICD-10-CM | POA: Insufficient documentation

## 2015-05-17 DIAGNOSIS — S01111A Laceration without foreign body of right eyelid and periocular area, initial encounter: Secondary | ICD-10-CM | POA: Diagnosis present

## 2015-05-17 DIAGNOSIS — E119 Type 2 diabetes mellitus without complications: Secondary | ICD-10-CM | POA: Diagnosis not present

## 2015-05-17 DIAGNOSIS — S0181XA Laceration without foreign body of other part of head, initial encounter: Secondary | ICD-10-CM

## 2015-05-17 DIAGNOSIS — I10 Essential (primary) hypertension: Secondary | ICD-10-CM | POA: Insufficient documentation

## 2015-05-17 DIAGNOSIS — W19XXXA Unspecified fall, initial encounter: Secondary | ICD-10-CM

## 2015-05-17 DIAGNOSIS — Z951 Presence of aortocoronary bypass graft: Secondary | ICD-10-CM | POA: Diagnosis not present

## 2015-05-17 DIAGNOSIS — I2510 Atherosclerotic heart disease of native coronary artery without angina pectoris: Secondary | ICD-10-CM | POA: Diagnosis not present

## 2015-05-17 MED ORDER — LIDOCAINE-EPINEPHRINE 2 %-1:100000 IJ SOLN
20.0000 mL | Freq: Once | INTRAMUSCULAR | Status: AC
  Administered 2015-05-17: 20 mL
  Filled 2015-05-17: qty 1

## 2015-05-17 NOTE — ED Notes (Signed)
MD at bedside. 

## 2015-05-17 NOTE — ED Provider Notes (Signed)
CSN: [id number]     Arrival date & time 05/17/15  1620 History   First MD Initiated Contact with Patient 05/17/15 1856     Chief Complaint  Patient presents with  . Fall  . Head Injury     (Consider location/radiation/quality/duration/timing/severity/associated sxs/prior Treatment) HPI    [name] is a 81 y.o. male he stood up from doing some exercises, and felt dizzy, which caused him to fall forward, striking his head on a piece of furniture. He did not lose consciousness. He is able to get up afterwards and walk. He complains only of laceration to the face. He denies headache, neck pain, back pain or extremity discomfort. He came here by private vehicle. He is been eating well recently. He denies recent fever, chills, cough, or vomiting. There are no other known modifying factors.   Past Medical History  Diagnosis Date  . Diabetes mellitus without complication ([place])   . Hypertension   . Hyperlipidemia   . Spinal stenosis   . Right bundle branch block   . Adenomatous polyp   . Bell's palsy   . CAD (coronary artery disease)   . Renal retinopathy, stage 3 (moderate)   . Melanoma ([place])   . Essential hypertension, benign 01/06/2014   Past Surgical History  Procedure Laterality Date  . Coronary artery bypass graft      4 vessel CABG left internal mammary to LAD, saphenous vein graft x4-diagonal-first obtuse marginal-posterior descending artery 02/11/07  . Left cataract surgery    . Laparoscopic appendectomy      Dr. Grandville Silos July 2005  . Tonsillectomy and adenoidectomy    . Mastectomy    . Melanoma excision      left jaw line 2009-resected   Family History  Problem Relation Age of Onset  . Emphysema Mother   . COPD Mother   . Heart attack Father   . CAD Father   . Emphysema Sister   . CAD Sister   . COPD Sister   . Stroke Neg Hx    Social History  Substance Use Topics  . Smoking status: Former Smoker    Quit date: 09/22/1978  . Smokeless tobacco: None  .  Alcohol Use: Yes    Review of Systems  All other systems reviewed and are negative.     Allergies  Lipitor and Metformin and related  Home Medications   Prior to Admission medications   Medication Sig Start Date End Date Taking? Authorizing Provider  amLODipine (NORVASC) 5 MG tablet Take 5 mg by mouth daily.   Yes Historical Provider, MD  aspirin 81 MG tablet Take 81 mg by mouth daily.   Yes Historical Provider, MD  Cholecalciferol (VITAMIN D) 2000 UNITS tablet Take 2,000 Units by mouth daily.   Yes Historical Provider, MD  Coenzyme Q10 (CO Q-10) 50 MG CAPS Take 2 tablets by mouth daily. 100 MG TOTAL   Yes Historical Provider, MD  glimepiride (AMARYL) 4 MG tablet Take 4 mg by mouth daily with breakfast.   Yes Historical Provider, MD  Multiple Vitamin (MULTIVITAMIN) capsule Take 1 capsule by mouth daily.   Yes Historical Provider, MD  nitroGLYCERIN (NITROSTAT) 0.4 MG SL tablet Place 0.4 mg under the tongue every 5 (five) minutes as needed for chest pain.   Yes Historical Provider, MD  Omega-3 Fatty Acids (FISH OIL DOUBLE STRENGTH) 1200 MG CAPS Take 1 capsule by mouth 2 (two) times daily. 2400 MG TOTAL   Yes Historical Provider, MD  Red Yeast  Rice Extract 600 MG CAPS Take 1 capsule by mouth 2 (two) times daily.    Yes Historical Provider, MD   BP 157/86 mmHg  Pulse 62  Temp(Src) 97.8 F (36.6 C) (Oral)  Resp 16  SpO2 100% Physical Exam  Constitutional: He is oriented to person, place, and time. He appears well-developed and well-nourished. No distress.  HENT:  Head: Normocephalic.  Right Ear: External ear normal.  Left Ear: External ear normal.  Gated laceration, right eyebrow. No associated crepitation or deformity.  Eyes: Conjunctivae and EOM are normal. Pupils are equal, round, and reactive to light.  No ocular entrapment.  Neck: Normal range of motion and phonation normal. Neck supple.  Cardiovascular: Normal rate, regular rhythm and normal heart sounds.    Pulmonary/Chest: Effort normal and breath sounds normal. He exhibits no bony tenderness.  Abdominal: Soft. There is no tenderness.  Musculoskeletal: Normal range of motion.  Neurological: He is alert and oriented to person, place, and time. No cranial nerve deficit or sensory deficit. He exhibits normal muscle tone. Coordination normal.  Skin: Skin is warm, dry and intact.  Psychiatric: He has a normal mood and affect. His behavior is normal. Judgment and thought content normal.  Nursing note and vitals reviewed.   ED Course  Procedures (including critical care time)  Medications  lidocaine-EPINEPHrine (XYLOCAINE W/EPI) 2 %-1:100000 (with pres) injection 20 mL (20 mLs Infiltration Given 05/17/15 1942)    Patient Vitals for the past 24 hrs:  BP Temp Temp src Pulse Resp SpO2  05/17/15 2049 157/86 mmHg - - 62 16 100 %  05/17/15 1939 - - - - - 99 %  05/17/15 1854 154/76 mmHg 97.8 F (36.6 C) Oral 61 14 99 %  05/17/15 1629 151/88 mmHg 97.6 F (36.4 C) Oral 69 16 98 %    LACERATION REPAIR Performed by: Richarda Blade Consent: Verbal consent obtained. Risks and benefits: risks, benefits and alternatives were discussed Patient identity confirmed: provided demographic data Time out performed prior to procedure Prepped and Draped in normal sterile fashion Wound explored Laceration Location: right eyebrow Laceration Length: 3.5cm No Foreign Bodies seen or palpated Anesthesia: local infiltration Local anesthetic: lidocaine 2% with epinephrine Anesthetic total: 5 ml Irrigation method: syringe Amount of cleaning: standard Skin closure: 5-0 Prolene Number of sutures or staples: 6 Technique: simple Patient tolerance: Patient tolerated the procedure well with no immediate complications.  11:48 PM Reevaluation with update and discussion. After initial assessment and treatment, an updated evaluation reveals no change in status. Findings discussed with patient and wife, all questions  answered. [place] Review Labs Reviewed - No data to display  Imaging Review Ct Head Wo Contrast  05/17/2015  CLINICAL DATA:  Initial encounter for fall with laceration above right eye. No loss of consciousness. EXAM: CT HEAD WITHOUT CONTRAST CT CERVICAL SPINE WITHOUT CONTRAST TECHNIQUE: Multidetector CT imaging of the head and cervical spine was performed following the standard protocol without intravenous contrast. Multiplanar CT image reconstructions of the cervical spine were also generated. COMPARISON:  MRI of 02/09/2008 brain.  Head CT 01/26/2008. FINDINGS: CT HEAD FINDINGS Sinuses/Soft tissues: Surgical changes about the left globe. Right supraorbital soft tissue swelling and bandage. There is also high right frontal scalp soft tissue swelling. No skull fracture.  Clear paranasal sinuses and mastoid air cells. Intracranial: Moderate low density in the periventricular white matter likely related to small vessel disease. Cerebral atrophy which may be mildly age advanced. Left middle cranial fossa arachnoid cyst again  identified. No significant mass effect. On the order of 4.5 x 2.7 cm. Vertebral and carotid intracranial atherosclerosis bilaterally. No hemorrhage, hydrocephalus, intra-axial fluid collection, or acute infarct. CT CERVICAL SPINE FINDINGS Spinal visualization through the bottom at T2. Prevertebral soft tissues are within normal limits. Bilateral carotid atherosclerosis. Spondylosis, with areas of bilateral central canal and neural foraminal narrowing. Skull base intact. Maintenance of vertebral body height and alignment. Prominent endplate osteophytes including at C5-6 and C6-7. Facet arthropathy which is worse on the right. Coronal reformats demonstrate a normal C1-C2 articulation. IMPRESSION: 1. Right supraorbital and high scalp soft tissue swelling. 2. No acute intracranial abnormality. Cerebral atrophy and small vessel ischemic change. 3. Advanced spondylosis, without  acute finding in the cervical spine. Electronically Signed   By: Abigail Miyamoto M.D.   On: 05/17/2015 20:08   Ct Cervical Spine Wo Contrast  05/17/2015  CLINICAL DATA:  Initial encounter for fall with laceration above right eye. No loss of consciousness. EXAM: CT HEAD WITHOUT CONTRAST CT CERVICAL SPINE WITHOUT CONTRAST TECHNIQUE: Multidetector CT imaging of the head and cervical spine was performed following the standard protocol without intravenous contrast. Multiplanar CT image reconstructions of the cervical spine were also generated. COMPARISON:  MRI of 02/09/2008 brain.  Head CT 01/26/2008. FINDINGS: CT HEAD FINDINGS Sinuses/Soft tissues: Surgical changes about the left globe. Right supraorbital soft tissue swelling and bandage. There is also high right frontal scalp soft tissue swelling. No skull fracture.  Clear paranasal sinuses and mastoid air cells. Intracranial: Moderate low density in the periventricular white matter likely related to small vessel disease. Cerebral atrophy which may be mildly age advanced. Left middle cranial fossa arachnoid cyst again identified. No significant mass effect. On the order of 4.5 x 2.7 cm. Vertebral and carotid intracranial atherosclerosis bilaterally. No hemorrhage, hydrocephalus, intra-axial fluid collection, or acute infarct. CT CERVICAL SPINE FINDINGS Spinal visualization through the bottom at T2. Prevertebral soft tissues are within normal limits. Bilateral carotid atherosclerosis. Spondylosis, with areas of bilateral central canal and neural foraminal narrowing. Skull base intact. Maintenance of vertebral body height and alignment. Prominent endplate osteophytes including at C5-6 and C6-7. Facet arthropathy which is worse on the right. Coronal reformats demonstrate a normal C1-C2 articulation. IMPRESSION: 1. Right supraorbital and high scalp soft tissue swelling. 2. No acute intracranial abnormality. Cerebral atrophy and small vessel ischemic change. 3. Advanced  spondylosis, without acute finding in the cervical spine. Electronically Signed   By: Abigail Miyamoto M.D.   On: 05/17/2015 20:08   I have personally reviewed and evaluated these images and lab results as part of my medical decision-making.   EKG Interpretation None      MDM   Final diagnoses:  Fall, initial encounter  Laceration of face, initial encounter  Head injury, initial encounter    Fall secondary to near syncope, without serious injury. No evidence for intracranial injury or cervical spine injury.  Nursing Notes Reviewed/ Care Coordinated Applicable Imaging Reviewed Interpretation of Laboratory Data incorporated into ED treatment  The patient appears reasonably screened and/or stabilized for discharge and I doubt any other medical condition or other [facility] requiring further screening, evaluation, or treatment in the ED at this time prior to discharge.  Plan: Home Medications- usual; Home Treatments- rest; return here if the recommended treatment, does not improve the symptoms; Recommended follow up- PCP 1 week, call tomorrow to check tetanus status    Daleen Bo, MD 05/17/15 2350

## 2015-05-17 NOTE — ED Notes (Signed)
Deep Lac above right eye.  Bleeding with gauze and coban. Pt is alert and oriented. No distress.

## 2015-05-17 NOTE — ED Notes (Signed)
Pt fell today, lacerated skin above right eyebrow. Laceration is deep with mild/moderate bleeding.

## 2015-05-17 NOTE — Discharge Instructions (Signed)
Facial Laceration ° A facial laceration is a cut on the face. These injuries can be painful and cause bleeding. Lacerations usually heal quickly, but they need special care to reduce scarring. °DIAGNOSIS  °Your health care provider will take a medical history, ask for details about how the injury occurred, and examine the wound to determine how deep the cut is. °TREATMENT  °Some facial lacerations may not require closure. Others may not be able to be closed because of an increased risk of infection. The risk of infection and the chance for successful closure will depend on various factors, including the amount of time since the injury occurred. °The wound may be cleaned to help prevent infection. If closure is appropriate, pain medicines may be given if needed. Your health care provider will use stitches (sutures), wound glue (adhesive), or skin adhesive strips to repair the laceration. These tools bring the skin edges together to allow for faster healing and a better cosmetic outcome. If needed, you may also be given a tetanus shot. °HOME CARE INSTRUCTIONS °· Only take over-the-counter or prescription medicines as directed by your health care provider. °· Follow your health care provider's instructions for wound care. These instructions will vary depending on the technique used for closing the wound. °For Sutures: °· Keep the wound clean and dry.   °· If you were given a bandage (dressing), you should change it at least once a day. Also change the dressing if it becomes wet or dirty, or as directed by your health care provider.   °· Wash the wound with soap and water 2 times a day. Rinse the wound off with water to remove all soap. Pat the wound dry with a clean towel.   °· After cleaning, apply a thin layer of the antibiotic ointment recommended by your health care provider. This will help prevent infection and keep the dressing from sticking.   °· You may shower as usual after the first 24 hours. Do not soak the  wound in water until the sutures are removed.   °· Get your sutures removed as directed by your health care provider. With facial lacerations, sutures should usually be taken out after 4-5 days to avoid stitch marks.   °· Wait a few days after your sutures are removed before applying any makeup. °For Skin Adhesive Strips: °· Keep the wound clean and dry.   °· Do not get the skin adhesive strips wet. You may bathe carefully, using caution to keep the wound dry.   °· If the wound gets wet, pat it dry with a clean towel.   °· Skin adhesive strips will fall off on their own. You may trim the strips as the wound heals. Do not remove skin adhesive strips that are still stuck to the wound. They will fall off in time.   °For Wound Adhesive: °· You may briefly wet your wound in the shower or bath. Do not soak or scrub the wound. Do not swim. Avoid periods of heavy sweating until the skin adhesive has fallen off on its own. After showering or bathing, gently pat the wound dry with a clean towel.   °· Do not apply liquid medicine, cream medicine, ointment medicine, or makeup to your wound while the skin adhesive is in place. This may loosen the film before your wound is healed.   °· If a dressing is placed over the wound, be careful not to apply tape directly over the skin adhesive. This may cause the adhesive to be pulled off before the wound is healed.   °· Avoid   prolonged exposure to sunlight or tanning lamps while the skin adhesive is in place. °· The skin adhesive will usually remain in place for 5-10 days, then naturally fall off the skin. Do not pick at the adhesive film.   °After Healing: °Once the wound has healed, cover the wound with sunscreen during the day for 1 full year. This can help minimize scarring. Exposure to ultraviolet light in the first year will darken the scar. It can take 1-2 years for the scar to lose its redness and to heal completely.  °SEEK MEDICAL CARE IF: °· You have a fever. °SEEK IMMEDIATE  MEDICAL CARE IF: °· You have redness, pain, or swelling around the wound.   °· You see a yellowish-white fluid (pus) coming from the wound.   °  °This information is not intended to replace advice given to you by your health care provider. Make sure you discuss any questions you have with your health care provider. °  °Document Released: 06/08/2004 Document Revised: 05/22/2014 Document Reviewed: 12/12/2012 °Elsevier Interactive Patient Education ©2016 [employer] ° °Head Injury, Adult °You have a head injury. Headaches and throwing up (vomiting) are common after a head injury. It should be easy to wake up from sleeping. Sometimes you must stay in the hospital. Most problems happen within the first 24 hours. Side effects may occur up to 7-10 days after the injury.  °WHAT ARE THE TYPES OF HEAD INJURIES? °Head injuries can be as minor as a bump. Some head injuries can be more severe. More severe head injuries include: °· A jarring injury to the brain (concussion). °· A bruise of the brain (contusion). This mean there is bleeding in the brain that can cause swelling. °· A cracked skull (skull fracture). °· Bleeding in the brain that collects, clots, and forms a bump (hematoma). °WHEN SHOULD I GET HELP RIGHT AWAY?  °· You are confused or sleepy. °· You cannot be woken up. °· You feel sick to your stomach (nauseous) or keep throwing up (vomiting). °· Your dizziness or unsteadiness is getting worse. °· You have very bad, lasting headaches that are not helped by medicine. Take medicines only as told by your doctor. °· You cannot use your arms or legs like normal. °· You cannot walk. °· You notice changes in the black spots in the center of the colored part of your eye (pupil). °· You have clear or bloody fluid coming from your nose or ears. °· You have trouble seeing. °During the next 24 hours after the injury, you must stay with someone who can watch you. This person should get help right away (call 911 in the [place] if  you start to shake and are not able to control it (have seizures), you pass out, or you are unable to wake up. °HOW CAN I PREVENT A HEAD INJURY IN THE FUTURE? °· Wear seat belts. °· Wear a helmet while bike riding and playing sports like football. °· Stay away from dangerous activities around the house. °WHEN CAN I RETURN TO NORMAL ACTIVITIES AND ATHLETICS? °See your doctor before doing these activities. You should not do normal activities or play contact sports until 1 week after the following symptoms have stopped: °· Headache that does not go away. °· Dizziness. °· Poor attention. °· Confusion. °· Memory problems. °· Sickness to your stomach or throwing up. °· Tiredness. °· Fussiness. °· Bothered by bright lights or loud noises. °· Anxiousness or depression. °· Restless sleep. °MAKE SURE YOU:  °· Understand these instructions. °· Will   watch your condition. °· Will get help right away if you are not doing well or get worse. °  °This information is not intended to replace advice given to you by your health care provider. Make sure you discuss any questions you have with your health care provider. °  °Document Released: 04/13/2008 Document Revised: 05/22/2014 Document Reviewed: 01/06/2013 °Elsevier Interactive Patient Education ©2016 [employer] ° °

## 2016-04-05 ENCOUNTER — Encounter: Payer: Self-pay | Admitting: Interventional Cardiology

## 2016-04-16 NOTE — Progress Notes (Signed)
Patient ID: [name], male   DOB: [birth year], 81 y.o.   MRN: [id number]     Cardiology Office Note   Date:  04/17/2016   ID:  [name], DOB 05-Feb-1935, MRN [id number]  PCP:  Mathews Argyle, MD    No chief complaint on file. f/u CAD   Wt Readings from Last 3 Encounters:  04/17/16 179 lb (81.2 kg)  04/15/15 172 lb (78 kg)  01/06/14 177 lb (80.3 kg)       History of Present Illness: [name] is a 81 y.o. male   who had CABG in 2008, at [facility]. He had a chest pressure at rest at that time. He did not recall any exertional symptoms. Crestor switched to Zocor due to expense and perceived side effects.  Ultimately, this was stopped.  He "does not trust" the statins.  He is more afraid of statins and prefers to take red yeast rice.   CAD/ASCVD:  Not using machines at gym- no longer goes to the gym. No angina. Walks a little at work. c/o Dizziness while getting up from sitting position. One fall- see below.  No syncope.   Denies : Chest pain.   No longer exercises at the [facility].  He is trying to do balance exercises.  He does stretching exercises occasionally.   Diaphoresis.  Leg edema.  Nitroglycerin.  Orthopnea.  Palpitations.  Syncope.   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 Overall, he fels like his stamina is again less than last year, due to less activity.  He had a fall after getting up from stretching on the floor.  He remembers the event.  He hit his head and needed nine stitches.    No sx like his prior angina.  He had severe chest pressure before his CABG.       Past Medical History:  Diagnosis Date  . Adenomatous polyp   . Bell's palsy   . CAD (coronary artery disease)   . Diabetes mellitus without complication ([place])   . Essential hypertension, benign 01/06/2014  . Hyperlipidemia   . Hypertension   . Melanoma ([place])   . Renal retinopathy, stage 3 (moderate)   . Right bundle branch block   . Spinal stenosis      Past Surgical History:  Procedure Laterality Date  . CORONARY ARTERY BYPASS GRAFT     4 vessel CABG left internal mammary to LAD, saphenous vein graft x4-diagonal-first obtuse marginal-posterior descending artery 02/11/07  . LAPAROSCOPIC APPENDECTOMY     Dr. Grandville Silos July 2005  . left cataract surgery    . MASTECTOMY    . MELANOMA EXCISION     left jaw line 2009-resected  . TONSILLECTOMY AND ADENOIDECTOMY       Current Outpatient Prescriptions  Medication Sig Dispense Refill  . amLODipine (NORVASC) 5 MG tablet Take 5 mg by mouth daily.    Marland Kitchen aspirin 81 MG tablet Take 81 mg by mouth daily.    . Cholecalciferol (VITAMIN D) 2000 UNITS tablet Take 2,000 Units by mouth daily.    . Coenzyme Q10 (CO Q-10) 50 MG CAPS Take 2 tablets by mouth daily. 100 MG TOTAL    . glimepiride (AMARYL) 4 MG tablet Take 4 mg by mouth daily with breakfast.    . Multiple Vitamin (MULTIVITAMIN) capsule Take 1 capsule by mouth daily.    . nitroGLYCERIN (NITROSTAT) 0.4 MG SL tablet Place 0.4 mg under the tongue every 5 (five) minutes  as needed for chest pain.    . Omega-3 Fatty Acids (FISH OIL DOUBLE STRENGTH) 1200 MG CAPS Take 1 capsule by mouth 2 (two) times daily. 2400 MG TOTAL    . Red Yeast Rice Extract 600 MG CAPS Take 1 capsule by mouth 2 (two) times daily.      No current facility-administered medications for this visit.     Allergies:   Lipitor [atorvastatin] and Metformin and related    Social History:  The patient  reports that he quit smoking about 37 years ago. He has never used smokeless tobacco. He reports that he drinks alcohol. He reports that he does not use drugs.   Family History:  The patient's family history includes CAD in his father and sister; COPD in his mother and sister; Emphysema in his mother and sister; Heart attack in his father.    ROS:  Please see the history of present illness.   Otherwise, review of systems are positive for decreased stamina.   All other systems are  reviewed and negative.    PHYSICAL EXAM: VS:  BP 124/72   Pulse 63   Ht 5\' 8"  (1.727 m)   Wt 179 lb (81.2 kg)   SpO2 97%   BMI 27.22 kg/m  , BMI Body mass index is 27.22 kg/m. GEN: Well nourished, well developed, in no acute distress  HEENT: normal  Neck: no JVD, carotid bruits, or masses Cardiac: RRR; no murmurs, rubs, or gallops,no edema  Respiratory:  clear to auscultation bilaterally, normal work of breathing GI: soft, nontender, nondistended, + BS MS: no deformity or atrophy  Skin: warm and dry, no rash Neuro:  Strength and sensation are intact Psych: euthymic mood, full affect   EKG:   The ekg ordered today demonstrates junctional rhythm, RBBB, LAFB, bifascicular; notch early in the QRS appears to be the retrograde Pwave   Recent Labs: No results found for requested labs within last 8760 hours.   Lipid Panel    Component Value Date/Time   CHOL 159 07/17/2014 0825   TRIG 114.0 07/17/2014 0825   HDL 37.50 (L) 07/17/2014 0825   CHOLHDL 4 07/17/2014 0825   VLDL 22.8 07/17/2014 0825   LDLCALC 99 07/17/2014 0825   LDLDIRECT 106.6 04/13/2014 1012     Other studies Reviewed: Additional studies/ records that were reviewed today with results demonstrating: reviewed cath from 2008.  LVEF 65%..   ASSESSMENT AND PLAN:  1. Presumed Junctional rhythm- with prolonged PR in the past, bifascicular block, now junctional.  Refer to EP.  Avoid rate slowing drugs.  Had one fall where he required stitches.  The ekg ordered today demonstrates junctional rhythm, RBBB, LAFB, bifascicular; notch early in the QRS appears to be the retrograde Pwave.  Progressive conduction disease.  Question whether he needs PPM.   2. CAD: Continue Aspirin Tablet Delayed Release, 81 MG, 1 tablet, Orally, once a day Start Nitroglycerin 0.4 mg tablet, 0.4 mg, 1 tablet as directed, SL, as directed prn chest pain, 1 vial Notes: No angina. Continue aggressive secondary prevention. He will let us know if he  has any symptoms similar to what he had prior to his bypass surgery.  3. AFib/bradycardia: AFib documented only at the time of CABG.  He never took Coumadin.  Has had bradycardia more recently.  No sx of severe bradycardia noted- one fall which is not clearly related to bradycardia but more orthostasis. 4. Hyperlipidemia:  Continue Co Q-10 Capsule, 50 MG, 2 tablets, Orally, Once a day  Notes: LDL in the 40s in , HDL in the 30s 4/12. Off of statins.  He is not willing to retry as noted above.  Continue red yeast rice.  Labs followed by Dr. Felipa Eth. LDL was 99 in 3/16. Obtain those results from 11/17.  5. HTN:  Continue Amlodipine Besylate Tablet, 5 MG, 1 tablet, Orally, Once a day Notes: COntrolled.  Consider ACE-I given diabetes.  Will defer to PMD. 6. DM: Managed by PMD.  He needs to increase exercise.    Current medicines are reviewed at length with the patient today.  The patient concerns regarding his medicines were addressed.  The following changes have been made:  No change  Labs/ tests ordered today include:   Orders Placed This Encounter  Procedures  . EKG 12-Lead    Recommend 150 minutes/week of aerobic exercise Low fat, low carb, high fiber diet recommended  Disposition:   FU in 1 year   Signed, Larae Grooms, MD  04/17/2016 9:18 AM    [facility] [address], [place], [place]  [address] Phone: [phone]; Fax: [phone]

## 2016-04-17 ENCOUNTER — Ambulatory Visit (INDEPENDENT_AMBULATORY_CARE_PROVIDER_SITE_OTHER): Payer: Medicare Other | Admitting: Interventional Cardiology

## 2016-04-17 ENCOUNTER — Encounter: Payer: Self-pay | Admitting: Interventional Cardiology

## 2016-04-17 VITALS — BP 124/72 | HR 63 | Ht 68.0 in | Wt 179.0 lb

## 2016-04-17 DIAGNOSIS — R001 Bradycardia, unspecified: Secondary | ICD-10-CM

## 2016-04-17 DIAGNOSIS — I10 Essential (primary) hypertension: Secondary | ICD-10-CM | POA: Diagnosis not present

## 2016-04-17 DIAGNOSIS — I2510 Atherosclerotic heart disease of native coronary artery without angina pectoris: Secondary | ICD-10-CM

## 2016-04-17 DIAGNOSIS — I4891 Unspecified atrial fibrillation: Secondary | ICD-10-CM

## 2016-04-17 DIAGNOSIS — E782 Mixed hyperlipidemia: Secondary | ICD-10-CM | POA: Diagnosis not present

## 2016-04-17 NOTE — Patient Instructions (Signed)
**Note De-Identified [name] Obfuscation** Medication Instructions:  Same-no changes  Labwork: None  Testing/Procedures: None  Follow-Up: Dr Irish Lack is referring you to one of our EP cardiologist due to your junctional rhythm.  Your physician wants you to follow-up in: 6 months with Dr Irish Lack. You will receive a reminder letter in the mail two months in advance. If you don't receive a letter, please call our office to schedule the follow-up appointment.     If you need a refill on your cardiac medications before your next appointment, please call your pharmacy.

## 2016-04-24 ENCOUNTER — Ambulatory Visit (INDEPENDENT_AMBULATORY_CARE_PROVIDER_SITE_OTHER): Payer: Medicare Other | Admitting: Ophthalmology

## 2016-04-24 DIAGNOSIS — E11319 Type 2 diabetes mellitus with unspecified diabetic retinopathy without macular edema: Secondary | ICD-10-CM

## 2016-04-24 DIAGNOSIS — D3132 Benign neoplasm of left choroid: Secondary | ICD-10-CM

## 2016-04-24 DIAGNOSIS — H43813 Vitreous degeneration, bilateral: Secondary | ICD-10-CM

## 2016-04-24 DIAGNOSIS — H338 Other retinal detachments: Secondary | ICD-10-CM

## 2016-04-24 DIAGNOSIS — I10 Essential (primary) hypertension: Secondary | ICD-10-CM | POA: Diagnosis not present

## 2016-04-24 DIAGNOSIS — H35033 Hypertensive retinopathy, bilateral: Secondary | ICD-10-CM

## 2016-04-24 DIAGNOSIS — E113293 Type 2 diabetes mellitus with mild nonproliferative diabetic retinopathy without macular edema, bilateral: Secondary | ICD-10-CM

## 2016-05-03 ENCOUNTER — Institutional Professional Consult (permissible substitution): Payer: Medicare Other | Admitting: Internal Medicine

## 2016-05-04 ENCOUNTER — Encounter: Payer: Self-pay | Admitting: Internal Medicine

## 2016-05-22 ENCOUNTER — Encounter: Payer: Self-pay | Admitting: Internal Medicine

## 2016-05-22 ENCOUNTER — Ambulatory Visit (INDEPENDENT_AMBULATORY_CARE_PROVIDER_SITE_OTHER): Payer: Medicare Other | Admitting: Internal Medicine

## 2016-05-22 VITALS — BP 150/76 | HR 59 | Ht 67.5 in | Wt 179.4 lb

## 2016-05-22 DIAGNOSIS — I452 Bifascicular block: Secondary | ICD-10-CM | POA: Diagnosis not present

## 2016-05-22 NOTE — Progress Notes (Signed)
Electrophysiology Office Note   Date:  05/22/2016   ID:  [name], DOB 1934/08/11, MRN [id number]  PCP:  Mathews Argyle, MD  Cardiologist:  Dr Irish Lack Primary Electrophysiologist: Thompson Grayer, MD    CC: dizziness   History of Present Illness: [name] is a 82 y.o. male who presents today for electrophysiology evaluation.  He presented for routine cardiology follow-up 04/17/16 and was noted to have sinus bradycardia (50s) with bifascicular block.  He was asymptomatic at that time. In retrospect, he reported  That 1/17 (a year prior) he had dizziness upon standing after doing floor stretches.  He had been exercising and he got up very quickly.  He became dizzy and fell but did not have LOC.  He has not had any further episodes of dizziness, presyncope, or syncope.  He states he is not very active but denies fatigue and exertional symptoms.  Today, he denies symptoms of palpitations, chest pain, shortness of breath, orthopnea, PND, lower extremity edema, claudication, bleeding, or neurologic sequela. The patient is tolerating medications without difficulties and is otherwise without complaint today.    Past Medical History:  Diagnosis Date  . Adenomatous polyp   . Bell's palsy   . Bifascicular block   . CAD (coronary artery disease)   . Diabetes mellitus without complication ([place])   . Essential hypertension, benign 01/06/2014  . Hyperlipidemia   . Melanoma ([place])   . Renal retinopathy, stage 3 (moderate)   . Spinal stenosis    Past Surgical History:  Procedure Laterality Date  . CORONARY ARTERY BYPASS GRAFT     4 vessel CABG left internal mammary to LAD, saphenous vein graft x4-diagonal-first obtuse marginal-posterior descending artery 02/11/07  . LAPAROSCOPIC APPENDECTOMY     Dr. Grandville Silos July 2005  . left cataract surgery    . MELANOMA EXCISION     left jaw line 2009-resected  . TONSILLECTOMY AND ADENOIDECTOMY       Current Outpatient Prescriptions    Medication Sig Dispense Refill  . amLODipine (NORVASC) 5 MG tablet Take 5 mg by mouth daily.    Marland Kitchen aspirin 81 MG tablet Take 81 mg by mouth daily.    . Cholecalciferol (VITAMIN D) 2000 UNITS tablet Take 2,000 Units by mouth daily.    . Coenzyme Q10 (CO Q-10) 50 MG CAPS Take 2 tablets by mouth daily. 100 MG TOTAL    . glimepiride (AMARYL) 4 MG tablet Take 4 mg by mouth daily with breakfast.    . Multiple Vitamin (MULTIVITAMIN) capsule Take 1 capsule by mouth daily.    . nitroGLYCERIN (NITROSTAT) 0.4 MG SL tablet Place 0.4 mg under the tongue every 5 (five) minutes as needed for chest pain.    . Omega-3 Fatty Acids (FISH OIL DOUBLE STRENGTH) 1200 MG CAPS Take 1 capsule by mouth 2 (two) times daily. 2400 MG TOTAL    . Red Yeast Rice Extract 600 MG CAPS Take 1 capsule by mouth 2 (two) times daily.      No current facility-administered medications for this visit.     Allergies:   Lipitor [atorvastatin] and Metformin and related   Social History:  The patient  reports that he quit smoking about 37 years ago. He has never used smokeless tobacco. He reports that he drinks alcohol. He reports that he does not use drugs.   Family History:  The patient's  family history includes CAD in his father and sister; COPD in his mother and sister; Emphysema in his mother and  sister; Heart attack in his father.    ROS:  Please see the history of present illness.   All other systems are reviewed and negative.    PHYSICAL EXAM: VS:  BP (!) 150/76   Pulse (!) 59   Ht 5' 7.5" (1.715 m)   Wt 179 lb 6.4 oz (81.4 kg)   BMI 27.68 kg/m  , BMI Body mass index is 27.68 kg/m. GEN: Well nourished, well developed, in no acute distress  HEENT: normal  Neck: no JVD, carotid bruits, or masses Cardiac: RRR; no murmurs, rubs, or gallops,no edema  Respiratory:  clear to auscultation bilaterally, normal work of breathing GI: soft, nontender, nondistended, + BS MS: no deformity or atrophy  Skin: warm and dry  Neuro:   Strength and sensation are intact Psych: euthymic mood, full affect  EKG:  EKG is ordered today. The ekg ordered today shows sinus rhythm 59 bpm, PR 248 msec, bifascicular block  Lipid Panel     Component Value Date/Time   CHOL 159 07/17/2014 0825   TRIG 114.0 07/17/2014 0825   HDL 37.50 (L) 07/17/2014 0825   CHOLHDL 4 07/17/2014 0825   VLDL 22.8 07/17/2014 0825   LDLCALC 99 07/17/2014 0825   LDLDIRECT 106.6 04/13/2014 1012     Wt Readings from Last 3 Encounters:  05/22/16 179 lb 6.4 oz (81.4 kg)  04/17/16 179 lb (81.2 kg)  04/15/15 172 lb (78 kg)      Other studies Reviewed: Additional studies/ records that were reviewed today include: Dr Hassell Done notes, prior ekgs  Review of the above records today demonstrates: as above   ASSESSMENT AND PLAN:  1.  Bifascicular block The patient has advanced conduction system disease by ekg but without symptoms or evidence of more advanced AV block.  I have reviewed ekg from 04/17/16 which I believe is similar to today (low voltage p wave).  I do not think that this is junctional rhythm.  Upon review, ekg appears to be stable from 2014. I had a long discussion with the patient about pacing or watchful waiting.  I suspect at some point, he may requiring pacing.  At this time, he is very clear that he would like to avoid pacing if possible.  He will continue to follow-up with Dr Irish Lack He will contact my office if he has any symptoms of AV block. No changes are made today  2. HTN Stable No changes today   Current medicines are reviewed at length with the patient today.   The patient does not have concerns regarding his medicines.  The following changes were made today:  none  Labs/ tests ordered today include:  No orders of the defined types were placed in this encounter.    Army Fossa, MD  05/22/2016 10:26 AM     [facility] [address] [place] [address] [phone]  (office) [phone] (fax)

## 2016-05-22 NOTE — Patient Instructions (Signed)
Medication Instructions:  Your physician recommends that you continue on your current medications as directed. Please refer to the Current Medication list given to you today.  Labwork: None ordered.  Testing/Procedures: None ordered.  Follow-Up: Your physician recommends that you schedule a follow-up appointment as needed.   Any Other Special Instructions Will Be Listed Below (If Applicable).     If you need a refill on your cardiac medications before your next appointment, please call your pharmacy.   

## 2016-10-23 ENCOUNTER — Encounter: Payer: Self-pay | Admitting: Interventional Cardiology

## 2016-11-09 ENCOUNTER — Encounter: Payer: Self-pay | Admitting: Interventional Cardiology

## 2016-11-09 ENCOUNTER — Ambulatory Visit (INDEPENDENT_AMBULATORY_CARE_PROVIDER_SITE_OTHER): Payer: Medicare Other | Admitting: Interventional Cardiology

## 2016-11-09 VITALS — BP 146/70 | HR 58 | Ht 67.5 in | Wt 179.4 lb

## 2016-11-09 DIAGNOSIS — I25119 Atherosclerotic heart disease of native coronary artery with unspecified angina pectoris: Secondary | ICD-10-CM

## 2016-11-09 DIAGNOSIS — E782 Mixed hyperlipidemia: Secondary | ICD-10-CM | POA: Diagnosis not present

## 2016-11-09 DIAGNOSIS — I10 Essential (primary) hypertension: Secondary | ICD-10-CM

## 2016-11-09 DIAGNOSIS — I452 Bifascicular block: Secondary | ICD-10-CM | POA: Diagnosis not present

## 2016-11-09 NOTE — Patient Instructions (Signed)
Medication Instructions:  Your physician recommends that you continue on your current medications as directed. Please refer to the Current Medication list given to you today.   Labwork: None ordered  Testing/Procedures: None ordered  Follow-Up: Your physician wants you to follow-up in: 6 month with Dr. Irish Lack. You will receive a reminder letter in the mail two months in advance. If you don't receive a letter, please call our office to schedule the follow-up appointment.   Any Other Special Instructions Will Be Listed Below (If Applicable).     If you need a refill on your cardiac medications before your next appointment, please call your pharmacy.

## 2016-11-09 NOTE — Progress Notes (Signed)
Cardiology Office Note   Date:  11/09/2016   ID:  [name], DOB Mar 21, 1935, MRN [id number]  PCP:  Kyle Manes, MD    No chief complaint on file. CAD   Wt Readings from Last 3 Encounters:  11/09/16 179 lb 6.4 oz (81.4 kg)  05/22/16 179 lb 6.4 oz (81.4 kg)  04/17/16 179 lb (81.2 kg)       History of Present Illness: [name] is a 82 y.o. male  who had CABG in 2008, at [facility]. He had a chest pressure at rest at that time. He did not recall any exertional symptoms.  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 Overall, he fels like his stamina is again less than last year, due to less activity.  Denies : Chest pain. Dizziness. Leg edema. Nitroglycerin use. Orthopnea. Palpitations. Paroxysmal nocturnal dyspnea. Shortness of breath. Syncope.   No falls or lightheadedness.  He does have conduction disease but no indication for pacer.  He has been seen by [name].    Past Medical History:  Diagnosis Date  . Adenomatous polyp   . Bell's palsy   . Bifascicular block   . CAD (coronary artery disease)   . Diabetes mellitus without complication ([place])   . Essential hypertension, benign 01/06/2014  . Hyperlipidemia   . Melanoma ([place])   . Renal retinopathy, stage 3 (moderate)   . Spinal stenosis     Past Surgical History:  Procedure Laterality Date  . CORONARY ARTERY BYPASS GRAFT     4 vessel CABG left internal mammary to LAD, saphenous vein graft x4-diagonal-first obtuse marginal-posterior descending artery 02/11/07  . LAPAROSCOPIC APPENDECTOMY     Dr. Grandville Silos July 2005  . left cataract surgery    . MELANOMA EXCISION     left jaw line 2009-resected  . TONSILLECTOMY AND ADENOIDECTOMY       Current Outpatient Prescriptions  Medication Sig Dispense Refill  . amLODipine (NORVASC) 5 MG tablet Take 5 mg by mouth daily.    Marland Kitchen aspirin 81 MG tablet Take 81 mg by mouth  daily.    . Cholecalciferol (VITAMIN D) 2000 UNITS tablet Take 2,000 Units by mouth daily.    . Coenzyme Q10 (CO Q-10) 50 MG CAPS Take 2 tablets by mouth daily. 100 MG TOTAL    . glimepiride (AMARYL) 4 MG tablet Take 4 mg by mouth daily with breakfast.    . Krill Oil 500 MG CAPS Take 500 mg by mouth 2 (two) times daily.    . Multiple Vitamin (MULTIVITAMIN) capsule Take 1 capsule by mouth daily.    . nitroGLYCERIN (NITROSTAT) 0.4 MG SL tablet Place 0.4 mg under the tongue every 5 (five) minutes as needed for chest pain.    . Red Yeast Rice Extract 600 MG CAPS Take 1 capsule by mouth 2 (two) times daily.      No current facility-administered medications for this visit.     Allergies:   Lipitor [atorvastatin] and Metformin and related    Social History:  The patient  reports that he quit smoking about 38 years ago. He has never used smokeless tobacco. He reports that he drinks alcohol. He reports that he does not use drugs.   Family History:  The patient's family history includes CAD in his father and sister; COPD in his mother and sister; Emphysema in his mother and sister; Heart attack  in his father.    ROS:  Please see the history of present illness.   Otherwise, review of systems are positive for right knee Bakers cyst recently, pain better.   All other systems are reviewed and negative.    PHYSICAL EXAM: VS:  BP (!) 146/70   Pulse (!) 58   Ht 5' 7.5" (1.715 m)   Wt 179 lb 6.4 oz (81.4 kg)   SpO2 98%   BMI 27.68 kg/m  , BMI Body mass index is 27.68 kg/m. GEN: Well nourished, well developed, in no acute distress  HEENT: normal  Neck: no JVD, carotid bruits, or masses Cardiac: bradycardic; no murmurs, rubs, or gallops,no edema  Respiratory:  clear to auscultation bilaterally, normal work of breathing GI: soft, nontender, nondistended, + BS MS: no deformity or atrophy  Skin: warm and dry, no rash Neuro:  Strength and sensation are intact Psych: euthymic mood, full  affect   EKG:   The ekg ordered in Jan 2018 demonstrates    Recent Labs: No results found for requested labs within last 8760 hours.   Lipid Panel    Component Value Date/Time   CHOL 159 07/17/2014 0825   TRIG 114.0 07/17/2014 0825   HDL 37.50 (L) 07/17/2014 0825   CHOLHDL 4 07/17/2014 0825   VLDL 22.8 07/17/2014 0825   LDLCALC 99 07/17/2014 0825   LDLDIRECT 106.6 04/13/2014 1012     Other studies Reviewed: Additional studies/ records that were reviewed today with results demonstrating: No from [name] reviewed.   ASSESSMENT AND PLAN:  1. CAD: No anginal symptoms on current medical therapy. Continue aspirin along with aggressive secondary prevention .  2. Bifascicular block: He has been seen by EP. They are waiting any symptoms of lightheadedness or syncope before considering pacemaker. We again went over the symptoms to watch for given his advanced conduction disease.  Recheck ECG in 6 months. 3. Hyperlipidemia: Last lipids in November 2017. LDL was 73 at that time. He does not want to take statins. He is taking red yeast rice. His labs are followed with his primary care doctor. 4. Hypertension: Continue amlodipine.  Consider ACE inhibitor given his diabetes. Will mention to his primary care doctor.   Current medicines are reviewed at length with the patient today.  The patient concerns regarding his medicines were addressed.  The following changes have been made:  No change  Labs/ tests ordered today include:  No orders of the defined types were placed in this encounter.   Recommend 150 minutes/week of aerobic exercise Low fat, low carb, high fiber diet recommended  Disposition:   FU in 6 months with ECG   Signed, Larae Grooms, MD  11/09/2016 8:21 AM    [address], [place], [place]  [address] Phone: [phone]; Fax: [phone]

## 2017-04-17 NOTE — Progress Notes (Signed)
Cardiology Office Note   Date:  04/18/2017   ID:  [name], DOB November 25, 1934, MRN [id number]  PCP:  Lajean Manes, MD    No chief complaint on file. CAD   Wt Readings from Last 3 Encounters:  04/18/17 176 lb 1.9 oz (79.9 kg)  11/09/16 179 lb 6.4 oz (81.4 kg)  05/22/16 179 lb 6.4 oz (81.4 kg)       History of Present Illness: [name] is a 82 y.o. male    who had CABG in 2008, at [facility]. He had a chest pressure at rest at that time. He did not recall any exertional symptoms.  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 Overall, he fels like his stamina is again less than last year, due to less activity.  He does have conduction disease but no indication for pacer.  He has been seen by Dr. Rayann Heman.  Has had slow HR in the past including junctional bradycardia.  He has had some right knee swelling and bakers cyst.  This has limited his walking.  Denies : Chest pain. Dizziness. Nitroglycerin use. Orthopnea. Palpitations. Paroxysmal nocturnal dyspnea. Shortness of breath. Syncope.   He had a GI bug yesterday.      Past Medical History:  Diagnosis Date  . Adenomatous polyp   . Bell's palsy   . Bifascicular block   . CAD (coronary artery disease)   . Diabetes mellitus without complication ([place])   . Essential hypertension, benign 01/06/2014  . Hyperlipidemia   . Melanoma ([place])   . Renal retinopathy, stage 3 (moderate) ([place])   . Spinal stenosis     Past Surgical History:  Procedure Laterality Date  . CORONARY ARTERY BYPASS GRAFT     4 vessel CABG left internal mammary to LAD, saphenous vein graft x4-diagonal-first obtuse marginal-posterior descending artery 02/11/07  . LAPAROSCOPIC APPENDECTOMY     Dr. Grandville Silos July 2005  . left cataract surgery    . MELANOMA EXCISION     left jaw line 2009-resected  . TONSILLECTOMY AND ADENOIDECTOMY       Current  Outpatient Medications  Medication Sig Dispense Refill  . amLODipine (NORVASC) 5 MG tablet Take 5 mg by mouth daily.    Marland Kitchen aspirin 81 MG tablet Take 81 mg by mouth daily.    . Cholecalciferol (VITAMIN D) 2000 UNITS tablet Take 2,000 Units by mouth daily.    . Coenzyme Q10 (CO Q-10) 50 MG CAPS Take 2 tablets by mouth daily. 100 MG TOTAL    . glimepiride (AMARYL) 4 MG tablet Take 4 mg by mouth daily with breakfast.    . Krill Oil 500 MG CAPS Take 500 mg by mouth 2 (two) times daily.    . Multiple Vitamin (MULTIVITAMIN) capsule Take 1 capsule by mouth daily.    . nitroGLYCERIN (NITROSTAT) 0.4 MG SL tablet Place 0.4 mg under the tongue every 5 (five) minutes as needed for chest pain.    . Red Yeast Rice Extract 600 MG CAPS Take 1 capsule by mouth 2 (two) times daily.      No current facility-administered medications for this visit.     Allergies:   Lipitor [atorvastatin] and Metformin and related    Social History:  The patient  reports that he quit smoking about 38 years ago. he has never used smokeless tobacco. He reports that he drinks alcohol.  He reports that he does not use drugs.   Family History:  The patient's family history includes CAD in his father and sister; COPD in his mother and sister; Emphysema in his mother and sister; Heart attack in his father.    ROS:  Please see the history of present illness.   Otherwise, review of systems are positive for knee swelling.   All other systems are reviewed and negative.    PHYSICAL EXAM: VS:  BP 124/66   Pulse 68   Ht 5' 7.5" (1.715 m)   Wt 176 lb 1.9 oz (79.9 kg)   SpO2 96%   BMI 27.18 kg/m  , BMI Body mass index is 27.18 kg/m. GEN: Well nourished, well developed, in no acute distress  HEENT: normal  Neck: no JVD, carotid bruits, or masses Cardiac: RRR; 2/6 murmur, no rubs, or gallops,no edema  Respiratory:  clear to auscultation bilaterally, normal work of breathing GI: soft, nontender, nondistended, + BS MS: no deformity or  atrophy  Skin: warm and dry, no rash Neuro:  Strength and sensation are intact Psych: euthymic mood, full affect   EKG:   The ekg ordered today demonstrates NSR, prolonged PR, bifascicular block   Recent Labs: No results found for requested labs within last 8760 hours.   Lipid Panel    Component Value Date/Time   CHOL 159 07/17/2014 0825   TRIG 114.0 07/17/2014 0825   HDL 37.50 (L) 07/17/2014 0825   CHOLHDL 4 07/17/2014 0825   VLDL 22.8 07/17/2014 0825   LDLCALC 99 07/17/2014 0825   LDLDIRECT 106.6 04/13/2014 1012     Other studies Reviewed: Additional studies/ records that were reviewed today with results demonstrating: no AAA by u/s in 2015.   ASSESSMENT AND PLAN:  1. CAD: No angina on medical therapy. 2. Bifascicular block: If he has syncope, would be a candidate for pacer.  No sx c/w bradycardia at this time.  Has had slow HR in the past including junctional bradycardia. 3. Hyperlipidemia: Needs lipids rechecked.  He has an appt with Dr. Felipa Eth coming up.   4. HTN: Continue current meds. Well controlled.    Current medicines are reviewed at length with the patient today.  The patient concerns regarding his medicines were addressed.  The following changes have been made:  No change  Labs/ tests ordered today include:  No orders of the defined types were placed in this encounter.   Recommend 150 minutes/week of aerobic exercise Low fat, low carb, high fiber diet recommended  Disposition:   FU in 6 months   Signed, Larae Grooms, MD  04/18/2017 8:22 AM    [facility] [address], [place], [place]  [address] Phone: [phone]; Fax: [phone]

## 2017-04-18 ENCOUNTER — Encounter: Payer: Self-pay | Admitting: Interventional Cardiology

## 2017-04-18 ENCOUNTER — Ambulatory Visit: Payer: Medicare Other | Admitting: Interventional Cardiology

## 2017-04-18 VITALS — BP 124/66 | HR 68 | Ht 67.5 in | Wt 176.1 lb

## 2017-04-18 DIAGNOSIS — I452 Bifascicular block: Secondary | ICD-10-CM | POA: Diagnosis not present

## 2017-04-18 DIAGNOSIS — I10 Essential (primary) hypertension: Secondary | ICD-10-CM

## 2017-04-18 DIAGNOSIS — E782 Mixed hyperlipidemia: Secondary | ICD-10-CM

## 2017-04-18 DIAGNOSIS — I25119 Atherosclerotic heart disease of native coronary artery with unspecified angina pectoris: Secondary | ICD-10-CM

## 2017-04-18 NOTE — Patient Instructions (Signed)

## 2017-04-24 ENCOUNTER — Ambulatory Visit (INDEPENDENT_AMBULATORY_CARE_PROVIDER_SITE_OTHER): Payer: Medicare Other | Admitting: Ophthalmology

## 2017-05-31 ENCOUNTER — Encounter (INDEPENDENT_AMBULATORY_CARE_PROVIDER_SITE_OTHER): Payer: Medicare Other | Admitting: Ophthalmology

## 2017-05-31 DIAGNOSIS — E113293 Type 2 diabetes mellitus with mild nonproliferative diabetic retinopathy without macular edema, bilateral: Secondary | ICD-10-CM

## 2017-05-31 DIAGNOSIS — H43813 Vitreous degeneration, bilateral: Secondary | ICD-10-CM

## 2017-05-31 DIAGNOSIS — I10 Essential (primary) hypertension: Secondary | ICD-10-CM | POA: Diagnosis not present

## 2017-05-31 DIAGNOSIS — H35033 Hypertensive retinopathy, bilateral: Secondary | ICD-10-CM | POA: Diagnosis not present

## 2017-05-31 DIAGNOSIS — D3132 Benign neoplasm of left choroid: Secondary | ICD-10-CM

## 2017-05-31 DIAGNOSIS — E11319 Type 2 diabetes mellitus with unspecified diabetic retinopathy without macular edema: Secondary | ICD-10-CM

## 2017-06-11 ENCOUNTER — Encounter: Payer: Self-pay | Admitting: Cardiology

## 2017-06-11 ENCOUNTER — Ambulatory Visit: Payer: Medicare Other | Admitting: Cardiology

## 2017-06-11 ENCOUNTER — Telehealth: Payer: Self-pay | Admitting: Interventional Cardiology

## 2017-06-11 VITALS — BP 126/78 | HR 64 | Ht 67.5 in | Wt 174.0 lb

## 2017-06-11 DIAGNOSIS — I10 Essential (primary) hypertension: Secondary | ICD-10-CM | POA: Diagnosis not present

## 2017-06-11 DIAGNOSIS — E782 Mixed hyperlipidemia: Secondary | ICD-10-CM | POA: Diagnosis not present

## 2017-06-11 DIAGNOSIS — I4892 Unspecified atrial flutter: Secondary | ICD-10-CM | POA: Diagnosis not present

## 2017-06-11 DIAGNOSIS — I452 Bifascicular block: Secondary | ICD-10-CM | POA: Diagnosis not present

## 2017-06-11 LAB — CBC
Hematocrit: 39.1 % (ref 37.5–51.0)
Hemoglobin: 13.7 g/dL (ref 13.0–17.7)
MCH: 31.6 pg (ref 26.6–33.0)
MCHC: 35 g/dL (ref 31.5–35.7)
MCV: 90 fL (ref 79–97)
Platelets: 194 10*3/uL (ref 150–379)
RBC: 4.34 x10E6/uL (ref 4.14–5.80)
RDW: 13.3 % (ref 12.3–15.4)
WBC: 6 10*3/uL (ref 3.4–10.8)

## 2017-06-11 LAB — BASIC METABOLIC PANEL
BUN/Creatinine Ratio: 24 (ref 10–24)
BUN: 32 mg/dL — ABNORMAL HIGH (ref 8–27)
CO2: 22 mmol/L (ref 20–29)
CREATININE: 1.33 mg/dL — AB (ref 0.76–1.27)
Calcium: 9.6 mg/dL (ref 8.6–10.2)
Chloride: 102 mmol/L (ref 96–106)
GFR, EST AFRICAN AMERICAN: 58 mL/min/{1.73_m2} — AB (ref >59)
GFR, EST NON AFRICAN AMERICAN: 50 mL/min/{1.73_m2} — AB (ref >59)
Glucose: 150 mg/dL — ABNORMAL HIGH (ref 65–99)
POTASSIUM: 4.7 mmol/L (ref 3.5–5.2)
Sodium: 141 mmol/L (ref 134–144)

## 2017-06-11 LAB — TSH: TSH: 3.52 u[IU]/mL (ref 0.450–4.500)

## 2017-06-11 LAB — MAGNESIUM: Magnesium: 2.2 mg/dL (ref 1.6–2.3)

## 2017-06-11 NOTE — Telephone Encounter (Signed)
Patient calling and states that his heart feels like it has been jumping around in his chest since Friday night. He states that one minute his heart rate is in the 40s and then the next it is in the 100s. Patient denies having any chest pain, SOB, lightheadedness, dizziness, syncope, or any other symptoms. Patient states that his BP and HR readings today are 151/71 HR 45, 128/74 HR 101, and 113/71 HR 97. Patient take amlodipine 5 mg QD. Patient denies taking any new medicines or excessive caffeine use. Patient has a known bifascicular block. Only known episode of Afib was at the time of his CABG. Patient states that he has not felt like this before and that his heart feels like it is flopping around in his chest and would like to be seen. Patient scheduled to come in today at 10:30 AM to be seen by Lyda Jester, PA and have an EKG.

## 2017-06-11 NOTE — Patient Instructions (Addendum)
Medication Instructions:  Your physician has recommended you make the following change in your medication:   START: Eliquis 5 mg twice daily  Labwork: TODAY: CBC, BMET, TSH, MG  Testing/Procedures: Your physician has requested that you have an echocardiogram. Echocardiography is a painless test that uses sound waves to create images of your heart. It provides your doctor with information about the size and shape of your heart and how well your heart's chambers and valves are working. This procedure takes approximately one hour. There are no restrictions for this procedure.  Follow-Up: Your physician recommends that you schedule a follow-up appointment in: 3 weeks with Dr. Irish Lack    Any Other Special Instructions Will Be Listed Below (If Applicable).  Echocardiogram An echocardiogram, or echocardiography, uses sound waves (ultrasound) to produce an image of your heart. The echocardiogram is simple, painless, obtained within a short period of time, and offers valuable information to your health care provider. The images from an echocardiogram can provide information such as:  Evidence of coronary artery disease (CAD).  Heart size.  Heart muscle function.  Heart valve function.  Aneurysm detection.  Evidence of a past heart attack.  Fluid buildup around the heart.  Heart muscle thickening.  Assess heart valve function.  Tell a health care provider about:  Any allergies you have.  All medicines you are taking, including vitamins, herbs, eye drops, creams, and over-the-counter medicines.  Any problems you or family members have had with anesthetic medicines.  Any blood disorders you have.  Any surgeries you have had.  Any medical conditions you have.  Whether you are pregnant or may be pregnant. What happens before the procedure? No special preparation is needed. Eat and drink normally. What happens during the procedure?  In order to produce an image of your  heart, gel will be applied to your chest and a wand-like tool (transducer) will be moved over your chest. The gel will help transmit the sound waves from the transducer. The sound waves will harmlessly bounce off your heart to allow the heart images to be captured in real-time motion. These images will then be recorded.  You may need an IV to receive a medicine that improves the quality of the pictures. What happens after the procedure? You may return to your normal schedule including diet, activities, and medicines, unless your health care provider tells you otherwise. This information is not intended to replace advice given to you by your health care provider. Make sure you discuss any questions you have with your health care provider. Document Released: 04/28/2000 Document Revised: 12/18/2015 Document Reviewed: 01/06/2013 Elsevier Interactive Patient Education  2017 Elsevier Inc.  Atrial Flutter Atrial flutter is a type of abnormal heart rhythm (arrhythmia). In atrial flutter, the heartbeat is fast but regular. There are two types of atrial flutter:  Paroxysmal atrial flutter. This type starts suddenly. It usually stops on its own soon after it starts.  Permanent atrial flutter. This type does not go away.  What are the causes? This condition may be caused by:  A heart condition or problem, such as: ? A heart attack. ? Heart failure. ? A heart valve problem.  A lung problem, such as: ? A blood clot in the lungs (pulmonary embolism, or PE). ? Chronic obstructive pulmonary disease.  Poorly controlled high blood pressure (hypertension).  Hyperthyroidism.  Caffeine.  Some decongestant cold medicines.  Low levels of minerals called electrolytes in the blood.  Cocaine.  What increases the risk? This condition is more  likely to develop in:  Elderly adults.  Men.  What are the signs or symptoms? Symptoms of this condition include:  A feeling that your heart is pounding  or racing (palpitations).  Shortness of breath.  Chest pain.  Feeling light-headed.  Dizziness.  Fainting.  How is this diagnosed? This condition may be diagnosed with tests, including:  An electrocardiogram (ECG). This is a painless test that records electrical signals in the heart.  Holter monitoring. For this test, you wear a device that records your heartbeat for 1-2 days.  Cardiac event monitoring. For this test, you wear a device that records your heartbeat for up to 30 days.  An echocardiogram. This is a painless test that uses sound waves to make a picture of your heart.  Stress test. This test records your heartbeat while you exercise.  Blood tests.  How is this treated? This condition may be treated with:  Treatment of any underlying conditions.  Medicine to make your heart beat more slowly.  Medicine to keep the condition from coming back.  A procedure to keep the condition under control. Some procedures to do this include: ? Cardioversion. During this procedure, medicines or an electrical shock are given to make the heart beat normally. ? Ablation. During this procedure, the heart tissue that is causing the problem is destroyed. This procedure may be done if atrial flutter lasts a long time or happens often.  Follow these instructions at home:  Take over-the-counter and prescription medicines only as told by your health care provider.  Do not take any new medicines without talking to your health care provider.  Do not use tobacco products, including cigarettes, chewing tobacco, or e-cigarettes. If you need help quitting, ask your health care provider.  Limit alcohol intake to no more than 1 drink per day for nonpregnant women and 2 drinks per day for men. One drink equals 12 oz of beer, 5 oz of wine, or 1 oz of hard liquor.  Try to reduce any stress. Stress can make your symptoms worse. Contact a health care provider if:  Your symptoms get worse. Get  help right away if:  You are dizzy.  You feel like fainting or you faint.  You have shortness of breath.  You feel pain or pressure in your chest.  You suddenly feel nauseous or you suddenly vomit.  There is a sudden change in your ability to speak, eat, or move.  You are sweating a lot for no reason. This information is not intended to replace advice given to you by your health care provider. Make sure you discuss any questions you have with your health care provider. Document Released: 09/17/2008 Document Revised: 09/08/2015 Document Reviewed: 11/13/2014 Elsevier Interactive Patient Education  [employer].    If you need a refill on your cardiac medications before your next appointment, please call your pharmacy.

## 2017-06-11 NOTE — Telephone Encounter (Signed)
New Message   Patient C/O Palpitations:  High priority if patient c/o lightheadedness, shortness of breath, or chest pain  1) How long have you had palpitations/irregular HR/ Afib? Are you having the symptoms now? Yes, currently experiencing irregular heartbeat. This has been occurring since Friday   2) Are you currently experiencing lightheadedness, SOB or CP? No symptoms.   3) Do you have a history of afib (atrial fibrillation) or irregular heart rhythm? No history   4) Have you checked your BP or HR? (document readings if available): This morning readings are 151/71 pulse 45, 128/74 pulse 101, 113/71 pulse 97  5) Are you experiencing any other symptoms? No

## 2017-06-11 NOTE — Progress Notes (Signed)
06/11/2017 Aliene Altes   1934/08/07  [id number]  Primary Physician Lajean Manes, MD Primary Cardiologist: Dr. Irish Lack   Reason for Visit/CC: Palpitations (New Onset Atrial Flutter)  HPI:  DEVERON SHAMOON is a 83 y.o. male who is being seen today for evaluation given recent development of palpitations.   He is followed by Dr. Irish Lack and has a h/o CAD s/p CABG in 2008. He also has conduction disease but no indication for PPM. He has been seen by Dr. Rayann Heman. He has had slow HRs in the past including junctional bradycardia. He also has a h/o HTN and DM.   He presents to clinic today as an add on given recent development of palpitations that began 3 days ago. Heart beating funny and pulse feels irregular. He denies CP and dyspnea. No fatigue.   EKG today shows atrial fibrillation with a CVR in the low 60s. He is currently asymptomatic. BP is stable. He denies any recent illnesses. No fever, chills,n/v/d. No cough. Denies caffeine. He occasionally will have a "drink/ cocktail" with dinner. He denies any previous h/o stroke. No h/o GIB/PUD. He denies any h/o falls. His wife has noted that he snores but he has never had a sleep study. No echo on file.     Current Meds  Medication Sig  . amLODipine (NORVASC) 5 MG tablet Take 5 mg by mouth daily.  Marland Kitchen aspirin 81 MG tablet Take 81 mg by mouth daily.  . Cholecalciferol (VITAMIN D) 2000 UNITS tablet Take 2,000 Units by mouth daily.  . Coenzyme Q10 (CO Q-10) 50 MG CAPS Take 2 tablets by mouth daily. 100 MG TOTAL  . glimepiride (AMARYL) 4 MG tablet Take 4 mg by mouth daily with breakfast.  . Krill Oil 500 MG CAPS Take 500 mg by mouth 2 (two) times daily.  . Multiple Vitamin (MULTIVITAMIN) capsule Take 1 capsule by mouth daily.  . nitroGLYCERIN (NITROSTAT) 0.4 MG SL tablet Place 0.4 mg under the tongue every 5 (five) minutes as needed for chest pain.  . Red Yeast Rice Extract 600 MG CAPS Take 1 capsule by mouth 2 (two) times daily.    Allergies    Allergen Reactions  . Lipitor [Atorvastatin]     Weakness.  . Metformin And Related     Unknown.    Past Medical History:  Diagnosis Date  . Adenomatous polyp   . Bell's palsy   . Bifascicular block   . CAD (coronary artery disease)   . Diabetes mellitus without complication ([place])   . Essential hypertension, benign 01/06/2014  . Hyperlipidemia   . Melanoma ([place])   . Renal retinopathy, stage 3 (moderate) ([place])   . Spinal stenosis    Family History  Problem Relation Age of Onset  . Emphysema Mother   . COPD Mother   . Heart attack Father   . CAD Father   . Emphysema Sister   . CAD Sister   . COPD Sister   . Stroke Neg Hx    Past Surgical History:  Procedure Laterality Date  . CORONARY ARTERY BYPASS GRAFT     4 vessel CABG left internal mammary to LAD, saphenous vein graft x4-diagonal-first obtuse marginal-posterior descending artery 02/11/07  . LAPAROSCOPIC APPENDECTOMY     Dr. Grandville Silos July 2005  . left cataract surgery    . MELANOMA EXCISION     left jaw line 2009-resected  . TONSILLECTOMY AND ADENOIDECTOMY     Social History   Socioeconomic History  . Marital status:  Married    Spouse name: Not on file  . Number of children: Not on file  . Years of education: Not on file  . Highest education level: Not on file  Social Needs  . Financial resource strain: Not on file  . Food insecurity - worry: Not on file  . Food insecurity - inability: Not on file  . Transportation needs - medical: Not on file  . Transportation needs - non-medical: Not on file  Occupational History  . Not on file  Tobacco Use  . Smoking status: Former Smoker    Last attempt to quit: 09/22/1978    Years since quitting: 38.7  . Smokeless tobacco: Never Used  Substance and Sexual Activity  . Alcohol use: Yes    Comment: 4-5 Oz burbon 4-5 days per weeks  . Drug use: No  . Sexual activity: Not on file  Other Topics Concern  . Not on file  Social History Narrative   Pt lives in  [place] with spouse.  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     Review of Systems: General: negative for chills, fever, night sweats or weight changes.  Cardiovascular: negative for chest pain, dyspnea on exertion, edema, orthopnea, palpitations, paroxysmal nocturnal dyspnea or shortness of breath Dermatological: negative for rash Respiratory: negative for cough or wheezing Urologic: negative for hematuria Abdominal: negative for nausea, vomiting, diarrhea, bright red blood per rectum, melena, or hematemesis Neurologic: negative for visual changes, syncope, or dizziness All other systems reviewed and are otherwise negative except as noted above.   Physical Exam:  Blood pressure 126/78, pulse 64, height 5' 7.5" (1.715 m), weight 174 lb (78.9 kg), SpO2 96 %.  General appearance: alert, cooperative and no distress Neck: no carotid bruit and no JVD Lungs: clear to auscultation bilaterally Heart: irregularly rhythm, regular rate Extremities: extremities normal, atraumatic, no cyanosis or edema Pulses: 2+ and symmetric Skin: Skin color, texture, turgor normal. No rashes or lesions Neurologic: Grossly normal  EKG atrial flutter w/ CVR 62 bpm -- personally reviewed   ASSESSMENT AND PLAN:   1. New Onset Atrial Flutter: his only symptom is occasional flutters in chest. No CP, dyspnea, fatigue, dizziness, syncope/ near syncope. No edema on exam. HR is well controlled in the low 60s. BP is stable. Given his h/o conduction disease, bradycardia/ junctional rhythm and low resting HR, we will avoid addition of AVN blocking agents at this time. His CHA2DS2 VASc score Is > 2 given age, HTN, DM and Vascular disease (CAD). We will start Eliquis with plans to consider outpatient DCCV in 3 weeks if still in flutter and if symptomatic. Based on weight and known renal function, we will start 5 mg BID. We will check a CBC and BMP for baseline assessment of H/H and renal function.  Will also look at K on BMP and will also check Mg level and TSH. Will also order a 2D echo to assess LVF/ wall motion. Given h/o snoring, we will also need to consider sleep study to exclude OSA. F/u with Dr. Irish Lack in 3 weeks to consider DCCV.   2. CAD: h/o CABG in 2008. Denies CP or dyspnea but with new atrial arrhthymia. Will order a 2D echo. Continue medical therapy.   F/u with Dr. Irish Lack in 3 weeks.    Doralyn Kirkes Ladoris Gene, MHS [facility] 06/11/2017 10:41 AM

## 2017-06-12 MED ORDER — APIXABAN 5 MG PO TABS: 5.0000 mg | ORAL_TABLET | Freq: Two times a day (BID) | ORAL | 11 refills | Status: DC

## 2017-06-12 NOTE — Addendum Note (Signed)
Addended by: Drue Novel I on: 06/12/2017 08:03 AM   Modules accepted: Orders

## 2017-06-14 ENCOUNTER — Ambulatory Visit (HOSPITAL_COMMUNITY): Payer: Medicare Other | Attending: Cardiology

## 2017-06-14 ENCOUNTER — Other Ambulatory Visit: Payer: Self-pay

## 2017-06-14 DIAGNOSIS — I4892 Unspecified atrial flutter: Secondary | ICD-10-CM | POA: Diagnosis not present

## 2017-06-14 DIAGNOSIS — I080 Rheumatic disorders of both mitral and aortic valves: Secondary | ICD-10-CM | POA: Insufficient documentation

## 2017-07-01 NOTE — Progress Notes (Signed)
Cardiology Office Note   Date:  07/01/2017   ID:  [name], DOB 08-09-1934, MRN [id number]  PCP:  Kyle Manes, MD    No chief complaint on file.  CAD, AFlutter  Wt Readings from Last 3 Encounters:  06/11/17 174 lb (78.9 kg)  04/18/17 176 lb 1.9 oz (79.9 kg)  11/09/16 179 lb 6.4 oz (81.4 kg)       History of Present Illness: [name] is a 83 y.o. male  who had CABG in 2008, at [facility]. He had a chest pressure at rest at that time. He did not recall any exertional symptoms.  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   Overall, he feels like his stamina is similar to last year.  He does have conduction disease but no indication for pacer. He has been seen by [name].  Has had slow HR in the past including junctional bradycardia.  He has had some right knee swelling and bakers cyst.  This has limited his walking.  He was diagnosed with atrial flutter in Jan 2019.  He was started on Eliquis.  HR has increased since having AFlutter.  Now in the 8s.    Denies : Chest pain. Dizziness. Leg edema. Nitroglycerin use. Orthopnea. Palpitations. Paroxysmal nocturnal dyspnea. Shortness of breath. Syncope.     Past Medical History:  Diagnosis Date  . Adenomatous polyp   . Bell's palsy   . Bifascicular block   . CAD (coronary artery disease)   . Diabetes mellitus without complication ([place])   . Essential hypertension, benign 01/06/2014  . Hyperlipidemia   . Melanoma ([place])   . Renal retinopathy, stage 3 (moderate) ([place])   . Spinal stenosis     Past Surgical History:  Procedure Laterality Date  . CORONARY ARTERY BYPASS GRAFT     4 vessel CABG left internal mammary to LAD, saphenous vein graft x4-diagonal-first obtuse marginal-posterior descending artery 02/11/07  . LAPAROSCOPIC APPENDECTOMY     Dr. Grandville Silos July 2005  . left cataract surgery    .  MELANOMA EXCISION     left jaw line 2009-resected  . TONSILLECTOMY AND ADENOIDECTOMY       Current Outpatient Medications  Medication Sig Dispense Refill  . amLODipine (NORVASC) 5 MG tablet Take 5 mg by mouth daily.    Marland Kitchen apixaban (ELIQUIS) 5 MG TABS tablet Take 1 tablet (5 mg total) by mouth 2 (two) times daily. 60 tablet 11  . aspirin 81 MG tablet Take 81 mg by mouth daily.    . Cholecalciferol (VITAMIN D) 2000 UNITS tablet Take 2,000 Units by mouth daily.    . Coenzyme Q10 (CO Q-10) 50 MG CAPS Take 2 tablets by mouth daily. 100 MG TOTAL    . glimepiride (AMARYL) 4 MG tablet Take 4 mg by mouth daily with breakfast.    . Krill Oil 500 MG CAPS Take 500 mg by mouth 2 (two) times daily.    . Multiple Vitamin (MULTIVITAMIN) capsule Take 1 capsule by mouth daily.    . nitroGLYCERIN (NITROSTAT) 0.4 MG SL tablet Place 0.4 mg under the tongue every 5 (five) minutes as needed for chest pain.    . Red Yeast Rice Extract 600 MG CAPS Take 1 capsule by mouth 2 (two) times daily.      No current facility-administered medications for this visit.  Allergies:   Lipitor [atorvastatin] and Metformin and related    Social History:  The patient  reports that he quit smoking about 38 years ago. he has never used smokeless tobacco. He reports that he drinks alcohol. He reports that he does not use drugs.   Family History:  The patient's family history includes CAD in his father and sister; COPD in his mother and sister; Emphysema in his mother and sister; Heart attack in his father.    ROS:  Please see the history of present illness.   Otherwise, review of systems are positive for palpitations resolved.  Wife reports snoring and observed apnea.   All other systems are reviewed and negative.    PHYSICAL EXAM: VS:  There were no vitals taken for this visit. , BMI There is no height or weight on file to calculate BMI. GEN: Well nourished, well developed, in no acute distress  HEENT: normal  Neck: no  JVD, carotid bruits, or masses Cardiac: RRR; no murmurs, rubs, or gallops,no edema  Respiratory:  clear to auscultation bilaterally, normal work of breathing GI: soft, nontender, nondistended, + BS MS: no deformity or atrophy  Skin: warm and dry, no rash Neuro:  Strength and sensation are intact Psych: euthymic mood, full affect   EKG:   The ekg ordered today demonstrates atrial flutter, rate controlled; rhythm is regular due to consistent 4:1 conduction   Recent Labs: 06/11/2017: BUN 32; Creatinine, Ser 1.33; Hemoglobin 13.7; Magnesium 2.2; Platelets 194; Potassium 4.7; Sodium 141; TSH 3.520   Lipid Panel    Component Value Date/Time   CHOL 159 07/17/2014 0825   TRIG 114.0 07/17/2014 0825   HDL 37.50 (L) 07/17/2014 0825   CHOLHDL 4 07/17/2014 0825   VLDL 22.8 07/17/2014 0825   LDLCALC 99 07/17/2014 0825   LDLDIRECT 106.6 04/13/2014 1012     Other studies Reviewed: Additional studies/ records that were reviewed today with results demonstrating: recent echo reviewed from 1/19.   ASSESSMENT AND PLAN:  1. CAD : No angina.  Stop aspirin since he is on Eliquis.  Continue aggressive secondary rpevention.  Now willing to try Zetia.  2. Bifascicular block/AFlutter: No longer with slow HR.  AFlutter has led to slightly higher resting HR.  We discussed cardioversion, but he is not interested because he fells well. He will continue Eliquis.  He is willing to have a sleep study for observed apnea.  His wife told him he snores as well.  Reconsider cardioversion if he develops sx. 3. Hyperlipidemia: Started Zetia.  OK to stop red yeast rice.  H ementions the expense of red yeast rice. 4. HTN: BP controlled.  Continue amlodipine for lower BP 5. No AAA by u/s in 2015.   6. DM: A1C 7.4. Continue medical therapy.  Careful diet with exercise.  Followed by PMD.    Current medicines are reviewed at length with the patient today.  The patient concerns regarding his medicines were  addressed.  The following changes have been made:  Stop aspirin  Labs/ tests ordered today include:  No orders of the defined types were placed in this encounter.   Recommend 150 minutes/week of aerobic exercise Low fat, low carb, high fiber diet recommended  Disposition:   FU in 6 months   Signed, Larae Grooms, MD  07/01/2017 5:39 PM    [facility] [address], [place], [place]  [address] Phone: [phone]; Fax: [phone]

## 2017-07-03 ENCOUNTER — Telehealth: Payer: Self-pay | Admitting: *Deleted

## 2017-07-03 ENCOUNTER — Encounter: Payer: Self-pay | Admitting: Interventional Cardiology

## 2017-07-03 ENCOUNTER — Ambulatory Visit: Payer: Medicare Other | Admitting: Interventional Cardiology

## 2017-07-03 VITALS — BP 122/68 | HR 63 | Ht 67.5 in | Wt 173.8 lb

## 2017-07-03 DIAGNOSIS — G4730 Sleep apnea, unspecified: Secondary | ICD-10-CM

## 2017-07-03 DIAGNOSIS — E782 Mixed hyperlipidemia: Secondary | ICD-10-CM

## 2017-07-03 DIAGNOSIS — I4892 Unspecified atrial flutter: Secondary | ICD-10-CM

## 2017-07-03 DIAGNOSIS — I25119 Atherosclerotic heart disease of native coronary artery with unspecified angina pectoris: Secondary | ICD-10-CM

## 2017-07-03 DIAGNOSIS — G4733 Obstructive sleep apnea (adult) (pediatric): Secondary | ICD-10-CM

## 2017-07-03 DIAGNOSIS — I452 Bifascicular block: Secondary | ICD-10-CM

## 2017-07-03 DIAGNOSIS — I10 Essential (primary) hypertension: Secondary | ICD-10-CM

## 2017-07-03 NOTE — Telephone Encounter (Signed)
-----   Message from Cleon Gustin, [place] sent at 07/03/2017  8:39 AM EST ----- Regarding: Sleep Study Patient needs sleep study per Dr. Irish Lack for Observed Apnea. ESS=5

## 2017-07-03 NOTE — Patient Instructions (Addendum)
Medication Instructions:  Your physician has recommended you make the following change in your medication:   1. STOP: Aspirin  2. STOP: red yeast rice extract  3. CONTINUE: Eliquis   Labwork: None ordered  Testing/Procedures: Your physician has recommended that you have a sleep study. This test records several body functions during sleep, including: brain activity, eye movement, oxygen and carbon dioxide blood levels, heart rate and rhythm, breathing rate and rhythm, the flow of air through your mouth and nose, snoring, body muscle movements, and chest and belly movement.  Follow-Up: Your physician wants you to follow-up in: 6 months with Dr. Irish Lack. You will receive a reminder letter in the mail two months in advance. If you don't receive a letter, please call our office to schedule the follow-up appointment.   Any Other Special Instructions Will Be Listed Below (If Applicable).     If you need a refill on your cardiac medications before your next appointment, please call your pharmacy.

## 2017-07-03 NOTE — Telephone Encounter (Signed)
Sent to sleep pool for precert  

## 2017-07-11 NOTE — Telephone Encounter (Signed)
Informed patient of upcoming sleep study and patient understanding was verbalized. Patient understands his sleep study is scheduled for Thursday Morch 14 2019. Patient understands his sleep study will be done at [facility] sleep lab. Patient understands he will receive a sleep packet in a week or so. Patient understands to call if he does not receive the sleep packet in a timely manner. Patient agrees with treatment and thanked me for call.

## 2017-07-26 ENCOUNTER — Ambulatory Visit (HOSPITAL_BASED_OUTPATIENT_CLINIC_OR_DEPARTMENT_OTHER): Payer: Medicare Other | Attending: Interventional Cardiology | Admitting: Cardiology

## 2017-07-26 VITALS — Ht 68.4 in | Wt 170.0 lb

## 2017-07-26 DIAGNOSIS — G4730 Sleep apnea, unspecified: Secondary | ICD-10-CM

## 2017-07-26 DIAGNOSIS — G4733 Obstructive sleep apnea (adult) (pediatric): Secondary | ICD-10-CM | POA: Insufficient documentation

## 2017-07-26 DIAGNOSIS — I441 Atrioventricular block, second degree: Secondary | ICD-10-CM | POA: Insufficient documentation

## 2017-07-31 NOTE — Procedures (Signed)
NAME: Aliene Altes DATE OF BIRTH:  Jan 02, 1935 MEDICAL RECORD NUMBER [id number]  LOCATION: [facility]  PHYSICIAN: Lior Hoen  DATE OF STUDY: 07/26/2017  SLEEP STUDY TYPE: Positive Airway Pressure Titration               REFERRING PHYSICIAN: Jettie Booze, MD   Gender: Male D.O.B: February 06, 1935 Age (years): 74 Referring Provider: Larae Grooms Height (inches): 58 Interpreting Physician: Fransico Him MD, ABSM Weight (lbs): 170 RPSGT: Baxter Flattery BMI: 26 MRN: [id number] Neck Size: 16.50  CLINICAL INFORMATION Sleep Study Type: Split Night CPAP  Indication for sleep study: Diabetes, Fatigue, OSA, Snoring, Witnessed Apneas  Epworth Sleepiness Score: 4  SLEEP STUDY TECHNIQUE As per the AASM Manual for the Scoring of Sleep and Associated Events v2.3 (April 2016) with a hypopnea requiring 4% desaturations.  The channels recorded and monitored were frontal, central and occipital EEG, electrooculogram (EOG), submentalis EMG (chin), nasal and oral airflow, thoracic and abdominal wall motion, anterior tibialis EMG, snore microphone, electrocardiogram, and pulse oximetry. Continuous positive airway pressure (CPAP) was initiated when the patient met split night criteria and was titrated according to treat sleep-disordered breathing.  MEDICATIONS Medications self-administered by patient taken the night of the study : N/A  RESPIRATORY PARAMETERS Diagnostic Total AHI (/hr): 38.1  RDI (/hr):38.1  OA Index (/hr): 19.4  CA Index (/hr): 1.3 REM AHI (/hr): 5.9  NREM AHI (/hr):42.2  Supine AHI (/hr):68.0  Non-supine AHI (/hr):13.27 Min O2 Sat (%):86.0  Mean O2 (%): 93.2  Time below 88% (min):0.6   Titration Optimal Pressure (cm):11  AHI at Optimal Pressure (/hr):13.9  Min O2 at Optimal Pressure (%):94.0 Supine % at Optimal (%):100  Sleep % at Optimal (%):69   SLEEP ARCHITECTURE The recording time for the entire night was 409.5 minutes.  During  a baseline period of 208.0 minutes, the patient slept for 182.5 minutes in REM and nonREM, yielding a sleep efficiency of 87.7%%. Sleep onset after lights out was 5.2 minutes with a REM latency of 110.0 minutes. The patient spent 6.8%% of the night in stage N1 sleep, 81.9%% in stage N2 sleep, 0.0%% in stage N3 and 11.2%% in REM.  During the titration period of 194.4 minutes, the patient slept for 86.0 minutes in REM and nonREM, yielding a sleep efficiency of 44.2%%. Sleep onset after CPAP initiation was 90.1 minutes with a REM latency of 17.0 minutes. The patient spent 5.8%% of the night in stage N1 sleep, 83.7%% in stage N2 sleep, 0.0%% in stage N3 and 10.5%% in REM.  CARDIAC DATA The 2 lead EKG demonstrated sinus rhythm. The mean heart rate was 100.0 beats per minute. Other EKG findings include: Second degree type I Wenkebach AV block.  LEG MOVEMENT DATA The total Periodic Limb Movements of Sleep (PLMS) were 0. The PLMS index was 0.0 .  IMPRESSIONS - Severe obstructive sleep apnea occurred during the diagnostic portion of the study (AHI = 38.1/hour). An optimal PAP pressure could not be selected for this patient due to ongoing respiratory events.  - No significant central sleep apnea occurred during the diagnostic portion of the study (CAI = 1.3/hour). - The patient had minimal or no oxygen desaturation during the diagnostic portion of the study (Min O2 = 86.0%) - No snoring was audible during the diagnostic portion of the study. 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 - Clinically significant periodic limb movements did not occur during sleep.  DIAGNOSIS - Obstructive Sleep Apnea (  327.23 [G47.33 ICD-10]) - Second degree Type I AV block  RECOMMENDATIONS - As patient continued to have respiratory events during CPAP titration portion of study, recommend full night CPAP titration . - Avoid alcohol, sedatives and other CNS depressants that may worsen sleep apnea  and disrupt normal sleep architecture. - Sleep hygiene should be reviewed to assess factors that may improve sleep quality. - Weight management and regular exercise should be initiated or continued.   [place], American Board of Sleep Medicine  ELECTRONICALLY SIGNED ON:  07/31/2017, 12:00 AM [place] PH: [phone]   FX: [phone] [place]

## 2017-08-06 ENCOUNTER — Other Ambulatory Visit: Payer: Self-pay | Admitting: *Deleted

## 2017-08-06 ENCOUNTER — Telehealth: Payer: Self-pay | Admitting: *Deleted

## 2017-08-06 MED ORDER — APIXABAN 5 MG PO TABS: 5.0000 mg | ORAL_TABLET | Freq: Two times a day (BID) | ORAL | 3 refills | Status: DC

## 2017-08-06 NOTE — Telephone Encounter (Signed)
Eliquis 5mg  refill request received; pt is 83 yrs old, Wt-78.8kg, Crea-1.33 on 06/11/17, last seen by Dr. Irish Lack on 07/03/17; will send in refill to requested pharmacy.  Refill was sent to a local Pharmacy but Mail order requesting electronically.

## 2017-08-06 NOTE — Telephone Encounter (Signed)
LMTCB for results. 

## 2017-08-06 NOTE — Telephone Encounter (Signed)
Informed patient of titration results and verbalized understanding was indicated. Patient understands he has sleep apnea but had insufficient time for CPAP titration so doctor Turner recommends a  full night in lab CPAP titration.  Patient understands his Titration study will be done at [facility] sleep lab. Patient understands he will receive a sleep packet in a week or so. Patient understands to call if he does not receive the sleep packet in a timely manner. Patient agrees with treatment and thanked me for call.  Referral sent to pre cert.

## 2017-08-06 NOTE — Telephone Encounter (Signed)
-----   Message from Sueanne Margarita, MD sent at 07/31/2017 12:03 AM EDT ----- Please let patient know that they have sleep apnea but had insufficient time for CPAP titration so recommend full night in lab CPAP titration. Please set up titration in the sleep lab.

## 2017-08-07 ENCOUNTER — Telehealth: Payer: Self-pay | Admitting: *Deleted

## 2017-08-07 DIAGNOSIS — G4733 Obstructive sleep apnea (adult) (pediatric): Secondary | ICD-10-CM

## 2017-08-07 NOTE — Telephone Encounter (Signed)
-----   Message from [place] sent at 08/06/2017  4:42 PM EDT ----- CPAP titration order needed.

## 2017-08-22 ENCOUNTER — Telehealth: Payer: Self-pay | Admitting: *Deleted

## 2017-08-22 NOTE — Telephone Encounter (Signed)
Staff message sent to [name] ok to schedule for titration study. No PA required. Insurance Decision ID #[id number].

## 2017-08-23 ENCOUNTER — Telehealth: Payer: Self-pay | Admitting: *Deleted

## 2017-08-23 ENCOUNTER — Encounter: Payer: Self-pay | Admitting: *Deleted

## 2017-08-23 NOTE — Telephone Encounter (Signed)
Patient is scheduled for CPAP Titration on Sep 13 2017. Patient understands his titration study will be done at [facility] sleep lab. Patient understands he will receive a letter in a week or so detailing appointment, date, time, and location. Patient understands to call if he does not receive the letter  in a timely manner. Patient agrees with treatment and thanked me for call.

## 2017-09-13 ENCOUNTER — Ambulatory Visit (HOSPITAL_BASED_OUTPATIENT_CLINIC_OR_DEPARTMENT_OTHER): Payer: Medicare Other | Attending: Cardiology | Admitting: Cardiology

## 2017-09-13 DIAGNOSIS — G4733 Obstructive sleep apnea (adult) (pediatric): Secondary | ICD-10-CM | POA: Insufficient documentation

## 2017-09-23 NOTE — Procedures (Signed)
   Patient Name: [name], [name] Date: 09/13/2017 Gender: Male D.O.B: 03-05-1935 Age (years): 69 Referring Provider: Fransico Him MD, ABSM Height (inches): 63 Interpreting Physician: Fransico Him MD, ABSM Weight (lbs): 170 RPSGT: [place], [place] BMI: 26 MRN: [id number] Neck Size: 16.50  CLINICAL INFORMATION The patient is referred for a CPAP titration to treat sleep apnea.  SLEEP STUDY TECHNIQUE As per the AASM Manual for the Scoring of Sleep and Associated Events v2.3 (April 2016) with a hypopnea requiring 4% desaturations.  The channels recorded and monitored were frontal, central and occipital EEG, electrooculogram (EOG), submentalis EMG (chin), nasal and oral airflow, thoracic and abdominal wall motion, anterior tibialis EMG, snore microphone, electrocardiogram, and pulse oximetry. Continuous positive airway pressure (CPAP) was initiated at the beginning of the study and titrated to treat sleep-disordered breathing.  MEDICATIONS Medications self-administered by patient taken the night of the study : N/A  TECHNICIAN COMMENTS Comments added by technician: Patient had difficulty initiating sleep. Patient was restless all through the night. Patient had more than two awakenings to use the bathroom Comments added by scorer: N/A  RESPIRATORY PARAMETERS Optimal PAP Pressure (cm):6  AHI at Optimal Pressure (/hr):3.5 Overall Minimal O2 (%):92.0  Supine % at Optimal Pressure (%):0 Minimal O2 at Optimal Pressure (%): 93.0   SLEEP ARCHITECTURE The study was initiated at 10:52:02 PM and ended at 5:38:18 AM.  Sleep onset time was 9.5 minutes and the sleep efficiency was 48.1%%. The total sleep time was 195.5 minutes.  The patient spent 27.1%% of the night in stage N1 sleep, 47.3%% in stage N2 sleep, 8.2%% in stage N3 and 17.39% in REM.Stage REM latency was 299.0 minutes  Wake after sleep onset was 201.3. Alpha intrusion was absent. Supine sleep was 16.88%.  CARDIAC DATA The 2  lead EKG demonstrated atrial fibrillation. The mean heart rate was 59.4 beats per minute.   LEG MOVEMENT DATA The total Periodic Limb Movements of Sleep (PLMS) were 0. The PLMS index was 0.0. A PLMS index of <15 is considered normal in adults.  IMPRESSIONS - The optimal PAP pressure was 6 cm of water. - Central sleep apnea was not noted during this titration (CAI = 4.9/h). - Significant oxygen desaturations were not observed during this titration (min O2 = 92.0%). - No snoring was audible during this study. - 2-lead EKG demonstrated: Atrial Fibrillation - Clinically significant periodic limb movements were not noted during this study. Arousals associated with PLMs were significant.  DIAGNOSIS - Obstructive Sleep Apnea (327.23 [G47.33 ICD-10]) - atrial fibrillation  RECOMMENDATIONS - Trial of CPAP therapy on 6 cm H2O with a Medium size Resmed Nasal CPAP Mask AirFit N30i mask and heated humidification. - Avoid alcohol, sedatives and other CNS depressants that may worsen sleep apnea and disrupt normal sleep architecture. - Sleep hygiene should be reviewed to assess factors that may improve sleep quality. - Weight management and regular exercise should be initiated or continued. - Return to Sleep Center for re-evaluation after 10 weeks of therapy  [Electronically signed] 09/23/2017 01:19 PM  Fransico Him MD, ABSM Diplomate, American Board of Sleep Medicine

## 2017-09-27 ENCOUNTER — Telehealth: Payer: Self-pay | Admitting: *Deleted

## 2017-09-27 NOTE — Telephone Encounter (Signed)
Called results LMTCB 

## 2017-09-27 NOTE — Telephone Encounter (Signed)
-----   Message from Sueanne Margarita, MD sent at 09/23/2017  1:21 PM EDT ----- Please let patient know that they had a successful PAP titration and let DME know that orders are in EPIC.  Please set up 10 week OV with me.

## 2017-10-01 ENCOUNTER — Telehealth: Payer: Self-pay | Admitting: *Deleted

## 2017-10-01 NOTE — Telephone Encounter (Signed)
    Informed patient of sleep study results and patient understanding was verbalized. Patient understands his sleep study showed he had a successful PAP titration and let DME know that orders are in Epic. Upon patient request DME selection is [facility] Patient understands he will be contacted by [facility] to set up his cpap. Patient understands to call if [facility] does not contact him with new setup in a timely manner. Patient understands they will be called once confirmation has been received from [facility] that they have received their new machine to schedule 10 week follow up appointment.  [facility] notified of new cpap order  Please add to airview Patient was grateful for the call and thanked me.

## 2017-10-01 NOTE — Telephone Encounter (Signed)
Informed patient of sleep study results and patient understanding was verbalized. Patient understands his sleep study showed he had a successful PAP titration and let DME know that orders are in Epic. Upon patient request DME selection is [facility] Patient understands he will be contacted by [facility] to set up his cpap. Patient understands to call if [facility] does not contact him with new setup in a timely manner. Patient understands they will be called once confirmation has been received from [facility] that they have received their new machine to schedule 10 week follow up appointment.  [facility] notified of new cpap order  Please add to airview Patient was grateful for the call and thanked me.

## 2017-10-01 NOTE — Addendum Note (Signed)
Addended by: Freada Bergeron on: 10/01/2017 01:46 PM   Modules accepted: Level of Service, SmartSet

## 2017-10-01 NOTE — Telephone Encounter (Signed)
-----   Message from Sueanne Margarita, MD sent at 09/23/2017  1:21 PM EDT ----- Please let patient know that they had a successful PAP titration and let DME know that orders are in EPIC.  Please set up 10 week OV with me.

## 2017-10-01 NOTE — Telephone Encounter (Signed)
This encounter was created in error - please disregard.

## 2018-01-02 NOTE — Progress Notes (Signed)
Cardiology Office Note   Date:  01/04/2018   ID:  [name], DOB 1934-11-21, MRN [id number]  PCP:  Lajean Manes, MD    No chief complaint on file.  CAD  Wt Readings from Last 3 Encounters:  01/04/18 177 lb (80.3 kg)  09/13/17 173 lb (78.5 kg)  07/26/17 170 lb (77.1 kg)       History of Present Illness: [name] is a 83 y.o. male  who had CABG in 2008, at [facility]. He had a chest pressure at rest at that time. He did not recall any exertional symptoms.  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  He does have conduction disease. He has been seen by Dr. Rayann Heman.Has had slow HR in the past including junctional bradycardia, but no indication for pacer.   He has had some right knee swelling and bakers cyst. This has limited his walking.  He was diagnosed with atrial flutter in Jan 2019.  He was started on Eliquis.  HR has increased since having AFlutter, to the 80s.   No AAA by u/s in 2015.    He started CPAP and thinks he feels better.   Denies : Chest pain. Dizziness. Leg edema. Nitroglycerin use. Orthopnea. Palpitations. Paroxysmal nocturnal dyspnea. Shortness of breath. Syncope.   No bleeding problems.  Eliquis cost is an issue, at the end of the year.     Past Medical History:  Diagnosis Date  . Adenomatous polyp   . Bell's palsy   . Bifascicular block   . CAD (coronary artery disease)   . Diabetes mellitus without complication ([place])   . Essential hypertension, benign 01/06/2014  . Hyperlipidemia   . Melanoma ([place])   . Renal retinopathy, stage 3 (moderate) ([place])   . Spinal stenosis     Past Surgical History:  Procedure Laterality Date  . CORONARY ARTERY BYPASS GRAFT     4 vessel CABG left internal mammary to LAD, saphenous vein graft x4-diagonal-first obtuse marginal-posterior descending artery 02/11/07  . LAPAROSCOPIC APPENDECTOMY       Dr. Grandville Silos July 2005  . left cataract surgery    . MELANOMA EXCISION     left jaw line 2009-resected  . TONSILLECTOMY AND ADENOIDECTOMY       Current Outpatient Medications  Medication Sig Dispense Refill  . ACCU-CHEK AVIVA PLUS test strip     . ACCU-CHEK SOFTCLIX LANCETS lancets     . amLODipine (NORVASC) 5 MG tablet Take 5 mg by mouth daily.    Marland Kitchen apixaban (ELIQUIS) 5 MG TABS tablet Take 1 tablet (5 mg total) by mouth 2 (two) times daily. 180 tablet 3  . Cholecalciferol (VITAMIN D) 2000 UNITS tablet Take 2,000 Units by mouth daily.    . Coenzyme Q10 (CO Q-10) 50 MG CAPS Take 2 tablets by mouth daily. 100 MG TOTAL    . ezetimibe (ZETIA) 10 MG tablet Take 10 mg by mouth daily.    Marland Kitchen glimepiride (AMARYL) 4 MG tablet Take 4 mg by mouth daily with breakfast.    . ipratropium (ATROVENT) 0.03 % nasal spray Place 2 sprays into both nostrils as needed for allergies.    . Multiple Vitamin (MULTIVITAMIN) capsule Take 1 capsule by mouth daily.    . nitroGLYCERIN (NITROSTAT) 0.4 MG SL tablet Place 0.4 mg under the tongue every 5 (five) minutes as needed for chest pain.  No current facility-administered medications for this visit.     Allergies:   Lipitor [atorvastatin] and Metformin and related    Social History:  The patient  reports that he quit smoking about 39 years ago. He has never used smokeless tobacco. He reports that he drinks alcohol. He reports that he does not use drugs.   Family History:  The patient's family history includes CAD in his father and sister; COPD in his mother and sister; Emphysema in his mother and sister; Heart attack in his father.    ROS:  Please see the history of present illness.   Otherwise, review of systems are positive for feels his irregular pulse; right knee pain- Bakers cyst.   All other systems are reviewed and negative.    PHYSICAL EXAM: VS:  BP 130/76   Pulse (!) 51   Ht 5' 7.5" (1.715 m)   Wt 177 lb (80.3 kg)   SpO2 98%   BMI 27.31  kg/m  , BMI Body mass index is 27.31 kg/m. GEN: Well nourished, well developed, in no acute distress  HEENT: normal  Neck: no JVD, carotid bruits, or masses Cardiac: irregularly irregular; no murmurs, rubs, or gallops,no edema  Respiratory:  clear to auscultation bilaterally, normal work of breathing GI: soft, nontender, nondistended, + BS MS: no deformity or atrophy  Skin: warm and dry, no rash Neuro:  Strength and sensation are intact Psych: euthymic mood, full affect  ECG: NSR, PACs, nonspecific ST changes  Recent Labs: 06/11/2017: BUN 32; Creatinine, Ser 1.33; Hemoglobin 13.7; Magnesium 2.2; Platelets 194; Potassium 4.7; Sodium 141; TSH 3.520   Lipid Panel    Component Value Date/Time   CHOL 159 07/17/2014 0825   TRIG 114.0 07/17/2014 0825   HDL 37.50 (L) 07/17/2014 0825   CHOLHDL 4 07/17/2014 0825   VLDL 22.8 07/17/2014 0825   LDLCALC 99 07/17/2014 0825   LDLDIRECT 106.6 04/13/2014 1012     Other studies Reviewed: Additional studies/ records that were reviewed today with results demonstrating: ECG from 2/19 showing atrial flutter- controlled rate .   ASSESSMENT AND PLAN:  1. CAD: No angina on medical therapy. COntinue aggressive secondary prevention.  2. Atrial flutter/bifascicular block: No sx of bradycardia.  Continue Eliquis for stroke prevention.  He prefers Eliquis to Coumadin which would be the only wat to reduce cost for him.  3. Hyperlipidemia: COntinue Zetia. Recheck lipids; TG was 202 in 2017.   4. HTN: COntrolled. COntinue current meds.  5. DM: A1C 7.2 in 8/19. Managed by PMD.   Current medicines are reviewed at length with the patient today.  The patient concerns regarding his medicines were addressed.  The following changes have been made:  No change  Labs/ tests ordered today include:  No orders of the defined types were placed in this encounter.   Recommend 150 minutes/week of aerobic exercise Low fat, low carb, high fiber diet  recommended  Disposition:   FU in 9 months   Signed, Larae Grooms, MD  01/04/2018 10:02 AM    [facility] [address], [place], [place]  [address] Phone: [phone]; Fax: [phone]

## 2018-01-04 ENCOUNTER — Encounter: Payer: Self-pay | Admitting: Interventional Cardiology

## 2018-01-04 ENCOUNTER — Ambulatory Visit: Payer: Medicare Other | Admitting: Interventional Cardiology

## 2018-01-04 VITALS — BP 130/76 | HR 51 | Ht 67.5 in | Wt 177.0 lb

## 2018-01-04 DIAGNOSIS — I4892 Unspecified atrial flutter: Secondary | ICD-10-CM | POA: Diagnosis not present

## 2018-01-04 DIAGNOSIS — E782 Mixed hyperlipidemia: Secondary | ICD-10-CM | POA: Diagnosis not present

## 2018-01-04 DIAGNOSIS — I25119 Atherosclerotic heart disease of native coronary artery with unspecified angina pectoris: Secondary | ICD-10-CM | POA: Diagnosis not present

## 2018-01-04 DIAGNOSIS — I10 Essential (primary) hypertension: Secondary | ICD-10-CM | POA: Diagnosis not present

## 2018-01-04 DIAGNOSIS — E1159 Type 2 diabetes mellitus with other circulatory complications: Secondary | ICD-10-CM

## 2018-01-04 NOTE — Patient Instructions (Signed)
Medication Instructions:  Your physician recommends that you continue on your current medications as directed. Please refer to the Current Medication list given to you today.   Labwork: None ordered  Testing/Procedures: None ordered  Follow-Up: Your physician wants you to follow-up in: 9 months with Dr. Varanasi. You will receive a reminder letter in the mail two months in advance. If you don't receive a letter, please call our office to schedule the follow-up appointment.   Any Other Special Instructions Will Be Listed Below (If Applicable).     If you need a refill on your cardiac medications before your next appointment, please call your pharmacy.   

## 2018-01-09 NOTE — Addendum Note (Signed)
Addended by: Jacinta Shoe on: 01/09/2018 12:10 PM   Modules accepted: Orders

## 2018-06-03 ENCOUNTER — Encounter (INDEPENDENT_AMBULATORY_CARE_PROVIDER_SITE_OTHER): Payer: Medicare Other | Admitting: Ophthalmology

## 2018-06-03 DIAGNOSIS — E113292 Type 2 diabetes mellitus with mild nonproliferative diabetic retinopathy without macular edema, left eye: Secondary | ICD-10-CM

## 2018-06-03 DIAGNOSIS — E11319 Type 2 diabetes mellitus with unspecified diabetic retinopathy without macular edema: Secondary | ICD-10-CM

## 2018-06-03 DIAGNOSIS — H35033 Hypertensive retinopathy, bilateral: Secondary | ICD-10-CM

## 2018-06-03 DIAGNOSIS — H43813 Vitreous degeneration, bilateral: Secondary | ICD-10-CM

## 2018-06-03 DIAGNOSIS — E113391 Type 2 diabetes mellitus with moderate nonproliferative diabetic retinopathy without macular edema, right eye: Secondary | ICD-10-CM

## 2018-06-03 DIAGNOSIS — D3132 Benign neoplasm of left choroid: Secondary | ICD-10-CM

## 2018-06-03 DIAGNOSIS — H35372 Puckering of macula, left eye: Secondary | ICD-10-CM

## 2018-06-03 DIAGNOSIS — H338 Other retinal detachments: Secondary | ICD-10-CM

## 2018-06-03 DIAGNOSIS — I10 Essential (primary) hypertension: Secondary | ICD-10-CM | POA: Diagnosis not present

## 2018-08-26 ENCOUNTER — Other Ambulatory Visit: Payer: Self-pay | Admitting: Interventional Cardiology

## 2018-08-26 NOTE — Telephone Encounter (Signed)
Pt is a 84 yr old male who last saw Dr. Irish Lack on 01/04/18, weight at that visit was 80.3Kg. Last noted SCr on 07/22/18 at [facility] was 1.34. Will refill Eliquis 5mg  BID.

## 2018-09-06 ENCOUNTER — Telehealth: Payer: Self-pay

## 2018-09-06 NOTE — Telephone Encounter (Signed)
Virtual Visit Pre-Appointment Phone Call  TELEPHONE CALL NOTE  [name] has been deemed a candidate for a follow-up tele-health visit to limit community exposure during the Covid-19 pandemic. I spoke with the patient via phone to ensure availability of phone/video source, confirm preferred email & phone number, and discuss instructions and expectations.  I reminded [name] to be prepared with any vital sign and/or heart rhythm information that could potentially be obtained via home monitoring, at the time of his visit. I reminded [name] to expect a phone call prior to his visit.  Patient agrees to consent below.  Cleon Gustin, RN 09/06/2018 4:09 PM   FULL LENGTH CONSENT FOR TELE-HEALTH VISIT   I hereby voluntarily request, consent and authorize [facility] and its employed or contracted physicians, physician assistants, nurse practitioners or other licensed health care professionals (the Practitioner), to provide me with telemedicine health care services (the "Services") as deemed necessary by the treating Practitioner. I acknowledge and consent to receive the Services by the Practitioner via telemedicine. I understand that the telemedicine visit will involve communicating with the Practitioner through live audiovisual communication technology and the disclosure of certain medical information by electronic transmission. I acknowledge that I have been given the opportunity to request an in-person assessment or other available alternative prior to the telemedicine visit and am voluntarily participating in the telemedicine visit.  I understand that I have the right to withhold or withdraw my consent to the use of telemedicine in the course of my care at any time, without affecting my right to future care or treatment, and that the Practitioner or I may terminate the telemedicine visit at any time. I understand that I have the right to inspect all information obtained  and/or recorded in the course of the telemedicine visit and may receive copies of available information for a reasonable fee.  I understand that some of the potential risks of receiving the Services via telemedicine include:  Marland Kitchen Delay or interruption in medical evaluation due to technological equipment failure or disruption; . Information transmitted may not be sufficient (e.g. poor resolution of images) to allow for appropriate medical decision making by the Practitioner; and/or  . In rare instances, security protocols could fail, causing a breach of personal health information.  Furthermore, I acknowledge that it is my responsibility to provide information about my medical history, conditions and care that is complete and accurate to the best of my ability. I acknowledge that Practitioner's advice, recommendations, and/or decision may be based on factors not within their control, such as incomplete or inaccurate data provided by me or distortions of diagnostic images or specimens that may result from electronic transmissions. I understand that the practice of medicine is not an exact science and that Practitioner makes no warranties or guarantees regarding treatment outcomes. I acknowledge that I will receive a copy of this consent concurrently upon execution via email to the email address I last provided but may also request a printed copy by calling the office of [facility].    I understand that my insurance will be billed for this visit.   I have read or had this consent read to me. . I understand the contents of this consent, which adequately explains the benefits and risks of the Services being provided via telemedicine.  . I have been provided ample opportunity to ask questions regarding this consent and the Services and have had my questions answered to my satisfaction. Marland Kitchen I  give my informed consent for the services to be provided through the use of telemedicine in my medical care  By  participating in this telemedicine visit I agree to the above.

## 2018-09-09 NOTE — Progress Notes (Signed)
Virtual Visit via Video Note   This visit type was conducted due to national recommendations for restrictions regarding the COVID-19 Pandemic (e.g. social distancing) in an effort to limit this patient's exposure and mitigate transmission in our community.  Due to his co-morbid illnesses, this patient is at least at moderate risk for complications without adequate follow up.  This format is felt to be most appropriate for this patient at this time.  All issues noted in this document were discussed and addressed.  A limited physical exam was performed with this format.  Please refer to the patient's chart for his consent to telehealth for [facility].   Evaluation Performed:  Follow-up visit  Date:  09/10/2018   ID:  [name], DOB [birth year], MRN [id number]  Patient Location: Home Provider Location: Home  PCP:  Lajean Manes, MD  Cardiologist:  Larae Grooms, MD  Electrophysiologist:  None   Chief Complaint:  f/u  History of Present Illness:    [name] is a 84 y.o. male with history of CAD status post CABG x 4 in 2008, hypertension, DM history of junctional bradycardia seen by Dr. Rayann Heman in the past no indication for pacemaker, atrial flutter 05/2017 started on Eliquis heart rate has increased since having atrial flutter to the 80s.  OSA on CPAP.  Last saw Dr. Irish Lack 01/04/2018 at which time he was doing well.  Declined statins but was on Zetia.  Denies chest pain, shortness of breath, palpitations, dizziness or presyncope. 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   The patient does not have symptoms concerning for COVID-19 infection (fever, chills, cough, or new shortness of breath).    Past Medical History:  Diagnosis Date  . Adenomatous polyp   . Bell's palsy   . Bifascicular block   . CAD (coronary artery disease)   . Diabetes mellitus without complication ([place])   . Essential hypertension, benign 01/06/2014  .  Hyperlipidemia   . Melanoma ([place])   . Renal retinopathy, stage 3 (moderate) ([place])   . Spinal stenosis    Past Surgical History:  Procedure Laterality Date  . CORONARY ARTERY BYPASS GRAFT     4 vessel CABG left internal mammary to LAD, saphenous vein graft x4-diagonal-first obtuse marginal-posterior descending artery 02/11/07  . LAPAROSCOPIC APPENDECTOMY     Dr. Grandville Silos July 2005  . left cataract surgery    . MELANOMA EXCISION     left jaw line 2009-resected  . TONSILLECTOMY AND ADENOIDECTOMY       Current Meds  Medication Sig  . ACCU-CHEK AVIVA PLUS test strip   . ACCU-CHEK SOFTCLIX LANCETS lancets   . amLODipine (NORVASC) 5 MG tablet Take 5 mg by mouth daily.  . Cholecalciferol (VITAMIN D) 2000 UNITS tablet Take 2,000 Units by mouth daily.  . Coenzyme Q10 (CO Q-10) 50 MG CAPS Take 2 tablets by mouth daily. 100 MG TOTAL  . ELIQUIS 5 MG TABS tablet TAKE 1 TABLET BY MOUTH TWO  TIMES DAILY  . ezetimibe (ZETIA) 10 MG tablet Take 10 mg by mouth daily.  Marland Kitchen glimepiride (AMARYL) 4 MG tablet Take 4 mg by mouth daily with breakfast.  . ipratropium (ATROVENT) 0.03 % nasal spray Place 2 sprays into both nostrils as needed for allergies.  . Multiple Vitamin (MULTIVITAMIN) capsule Take 1 capsule by mouth daily.  . nitroGLYCERIN (NITROSTAT) 0.4 MG SL tablet Place 1 tablet (0.4 mg total) under the tongue every 5 (  five) minutes as needed for chest pain.  . [DISCONTINUED] nitroGLYCERIN (NITROSTAT) 0.4 MG SL tablet Place 0.4 mg under the tongue every 5 (five) minutes as needed for chest pain.     Allergies:   Lipitor [atorvastatin] and Metformin and related   Social History   Tobacco Use  . Smoking status: Former Smoker    Last attempt to quit: 09/22/1978    Years since quitting: 39.9  . Smokeless tobacco: Never Used  Substance Use Topics  . Alcohol use: Yes    Comment: 4-5 Oz burbon 4-5 days per weeks  . Drug use: No     Family Hx: The patient's family history includes CAD in his father  and sister; COPD in his mother and sister; Emphysema in his mother and sister; Heart attack in his father. There is no history of Stroke.  ROS:   Please see the history of present illness.    Review of Systems  Constitution: Negative.  HENT: Negative.   Cardiovascular: Negative.   Respiratory: Negative.   Endocrine: Negative.   Hematologic/Lymphatic: Negative.   Musculoskeletal: Negative.   Gastrointestinal: Negative.   Genitourinary: Negative.   Neurological: Negative.     All other systems reviewed and are negative.   Prior CV studies:   The following studies were reviewed today: 2D echo 06/14/2017 Study Conclusions   - Left ventricle: The cavity size was normal. Systolic function was   normal. The estimated ejection fraction was in the range of 50%   to 55%. Wall motion was normal; there were no regional wall   motion abnormalities. The study is not technically sufficient to   allow evaluation of LV diastolic function. - Aortic valve: Valve mobility was restricted. There was mild   regurgitation. - Aortic root: The aortic root was normal in size. - Ascending aorta: The ascending aorta was mildly dilated measuring   40 mm. - Mitral valve: Calcified annulus. Mildly thickened leaflets .   There was mild regurgitation. - Right ventricle: The cavity size was mildly dilated. Wall   thickness was normal. Systolic function was normal. - Tricuspid valve: There was trivial regurgitation. - Pulmonary arteries: Systolic pressure was within the normal   range. - Inferior vena cava: The vessel was normal in size. The   respirophasic diameter changes were in the normal range (= 50%),   consistent with normal central venous pressure. - Pericardium, extracardiac: There was no pericardial effusion.     Labs/Other Tests and Data Reviewed:    EKG:  No ECG reviewed.  Recent Labs: No results found for requested labs within last 8760 hours.   Recent Lipid Panel Lab Results   Component Value Date/Time   CHOL 159 07/17/2014 08:25 AM   TRIG 114.0 07/17/2014 08:25 AM   HDL 37.50 (L) 07/17/2014 08:25 AM   CHOLHDL 4 07/17/2014 08:25 AM   LDLCALC 99 07/17/2014 08:25 AM   LDLDIRECT 106.6 04/13/2014 10:12 AM    Wt Readings from Last 3 Encounters:  09/10/18 174 lb (78.9 kg)  01/04/18 177 lb (80.3 kg)  09/13/17 173 lb (78.5 kg)     Objective:    Vital Signs:  BP 122/73   Pulse 72   Ht 5' 7.5" (1.715 m)   Wt 174 lb (78.9 kg)   BMI 26.85 kg/m    VITAL SIGNS:  reviewed GEN:  no acute distress RESPIRATORY:  normal respiratory effort, symmetric expansion CARDIOVASCULAR:  no peripheral edema  ASSESSMENT & PLAN:    1. CAD status post CABG  x4 in 2008-no angina 2. History of atrial fibrillation/flutter with bifascicular block on Eliquis. Crt 1.34 on 07/22/18 Hbg 14.4-no bleeding problems 3.  History of junctional bradycardia so not on rate lowering medications-rate came up when in atrial fib/flutter 4. Essential hypertension well controlled on amlodipine 5. Hyperlipidemia on Zetia-LDL 80 07/22/18-down from a year ago 6. Type 2 diabetes mellitus-Hbg A1C was 7.6-trying to cut back on sweets. 7. CKD stage II last creatinine 1.33-05/2017  COVID-19 Education: The signs and symptoms of COVID-19 were discussed with the patient and how to seek care for testing (follow up with PCP or arrange E-visit).   The importance of social distancing was discussed today.  Time:   Today, I have spent 15 minutes with the patient with telehealth technology discussing the above problems.     Medication Adjustments/Labs and Tests Ordered: Current medicines are reviewed at length with the patient today.  Concerns regarding medicines are outlined above.   Tests Ordered: No orders of the defined types were placed in this encounter.   Medication Changes: Meds ordered this encounter  Medications  . nitroGLYCERIN (NITROSTAT) 0.4 MG SL tablet    Sig: Place 1 tablet (0.4 mg total) under  the tongue every 5 (five) minutes as needed for chest pain.    Dispense:  25 tablet    Refill:  1    Disposition:  Follow up in 1 year(s) Dr. Irish Lack.   Sumner Boast, PA-C  09/10/2018 9:34 AM    [facility]

## 2018-09-10 ENCOUNTER — Encounter: Payer: Self-pay | Admitting: Physician Assistant

## 2018-09-10 ENCOUNTER — Other Ambulatory Visit: Payer: Self-pay

## 2018-09-10 ENCOUNTER — Telehealth (INDEPENDENT_AMBULATORY_CARE_PROVIDER_SITE_OTHER): Payer: Medicare Other | Admitting: Physician Assistant

## 2018-09-10 VITALS — BP 122/73 | HR 72 | Ht 67.5 in | Wt 174.0 lb

## 2018-09-10 DIAGNOSIS — E782 Mixed hyperlipidemia: Secondary | ICD-10-CM

## 2018-09-10 DIAGNOSIS — I10 Essential (primary) hypertension: Secondary | ICD-10-CM

## 2018-09-10 DIAGNOSIS — R001 Bradycardia, unspecified: Secondary | ICD-10-CM

## 2018-09-10 DIAGNOSIS — E1159 Type 2 diabetes mellitus with other circulatory complications: Secondary | ICD-10-CM

## 2018-09-10 DIAGNOSIS — I25119 Atherosclerotic heart disease of native coronary artery with unspecified angina pectoris: Secondary | ICD-10-CM | POA: Diagnosis not present

## 2018-09-10 DIAGNOSIS — I4891 Unspecified atrial fibrillation: Secondary | ICD-10-CM

## 2018-09-10 MED ORDER — NITROGLYCERIN 0.4 MG SL SUBL: 0.4000 mg | SUBLINGUAL_TABLET | SUBLINGUAL | 1 refills | Status: DC | PRN

## 2018-09-10 NOTE — Patient Instructions (Signed)

## 2018-09-17 ENCOUNTER — Ambulatory Visit: Payer: Medicare Other | Admitting: Interventional Cardiology

## 2019-01-14 ENCOUNTER — Other Ambulatory Visit: Payer: Self-pay | Admitting: Interventional Cardiology

## 2019-01-14 NOTE — Telephone Encounter (Signed)
Eliquis 5mg  refill request received, pt is 84yrs old, weight-78.9kg, Crea-1.34 on 07/22/2018 via [facility] at [facility] PCP, Diagnosis-Aflutter, and last seen by Estella Husk on 09/10/2018 via Telemedicine. Dose is appropriate based on dosing criteria. Will send in refill to requested pharmacy.

## 2019-03-11 ENCOUNTER — Encounter (INDEPENDENT_AMBULATORY_CARE_PROVIDER_SITE_OTHER): Payer: Self-pay

## 2019-06-04 ENCOUNTER — Encounter (INDEPENDENT_AMBULATORY_CARE_PROVIDER_SITE_OTHER): Payer: Medicare Other | Admitting: Ophthalmology

## 2019-06-04 DIAGNOSIS — D3132 Benign neoplasm of left choroid: Secondary | ICD-10-CM

## 2019-06-04 DIAGNOSIS — H338 Other retinal detachments: Secondary | ICD-10-CM

## 2019-06-04 DIAGNOSIS — H43813 Vitreous degeneration, bilateral: Secondary | ICD-10-CM

## 2019-06-04 DIAGNOSIS — E11319 Type 2 diabetes mellitus with unspecified diabetic retinopathy without macular edema: Secondary | ICD-10-CM | POA: Diagnosis not present

## 2019-06-04 DIAGNOSIS — I10 Essential (primary) hypertension: Secondary | ICD-10-CM | POA: Diagnosis not present

## 2019-06-04 DIAGNOSIS — H35372 Puckering of macula, left eye: Secondary | ICD-10-CM

## 2019-06-04 DIAGNOSIS — H35033 Hypertensive retinopathy, bilateral: Secondary | ICD-10-CM

## 2019-09-10 ENCOUNTER — Ambulatory Visit: Payer: Medicare Other | Admitting: Interventional Cardiology

## 2019-11-25 ENCOUNTER — Other Ambulatory Visit: Payer: Self-pay | Admitting: Interventional Cardiology

## 2019-11-25 NOTE — Telephone Encounter (Signed)
Pt last saw Ermalinda Barrios, PA on 09/10/18, pt is overdue for follow-up.  Last labs 09/01/19 Creat 1.19 at [facility] per [facility], age 85, weight 78.9kg, based on specified criteria pt is on appropriate dosage of Eliquis 5mg  BID.  Pt needs OV for rx refill.  Will send note to schedulers to contact pt to schedule OV with Dr Irish Lack as he is overdue for follow-up.  Will await appt to refill rx.

## 2019-11-27 NOTE — Telephone Encounter (Signed)
Pt has made an appt to see Dr Irish Lack on 01/01/20.  Will refill Eliquis rx to get pt to appt.

## 2019-12-31 NOTE — Progress Notes (Signed)
Cardiology Office Note   Date:  01/01/2020   ID:  [name], DOB August 12, 1934, MRN [id number]  PCP:  Lajean Manes, MD    No chief complaint on file.  CAD  Wt Readings from Last 3 Encounters:  01/01/20 172 lb (78 kg)  09/10/18 174 lb (78.9 kg)  01/04/18 177 lb (80.3 kg)       History of Present Illness: [name] is a 85 y.o. male  who had CABG in 2008, at [facility]. He had a chest pressure at rest at that time. He did not recall any exertional symptoms.  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  No longer doing that since he moved.   He does have conduction disease. He has been seen by Dr. Rayann Heman.Has had slow HR in the past including junctional bradycardia, but no indication for pacer.   He has had some right knee swelling and bakers cyst. This has limited his walking.  He was diagnosed with atrial flutter in Jan 2019. He was started on Eliquis. Cost has been an issue by the end of the year.  HR has increased since having AFlutter, to the 80s.  No AAA by u/s in 2015.  He started CPAP and thinks he feels better.   Since the last visit, he has moved to [facility].    Denies : Chest pain. Dizziness. Leg edema. Nitroglycerin use. Orthopnea. Palpitations. Paroxysmal nocturnal dyspnea. Shortness of breath. Syncope.   Will be moving to [place].        Past Medical History:  Diagnosis Date  . Adenomatous polyp   . Bell's palsy   . Bifascicular block   . CAD (coronary artery disease)   . Diabetes mellitus without complication ([place])   . Essential hypertension, benign 01/06/2014  . Hyperlipidemia   . Melanoma ([place])   . Renal retinopathy, stage 3 (moderate)   . Spinal stenosis     Past Surgical History:  Procedure Laterality Date  . CORONARY ARTERY BYPASS GRAFT     4 vessel CABG left internal mammary to LAD, saphenous vein graft  x4-diagonal-first obtuse marginal-posterior descending artery 02/11/07  . LAPAROSCOPIC APPENDECTOMY     Dr. Grandville Silos July 2005  . left cataract surgery    . MELANOMA EXCISION     left jaw line 2009-resected  . TONSILLECTOMY AND ADENOIDECTOMY       Current Outpatient Medications  Medication Sig Dispense Refill  . ACCU-CHEK AVIVA PLUS test strip     . ACCU-CHEK SOFTCLIX LANCETS lancets     . amLODipine (NORVASC) 5 MG tablet Take 5 mg by mouth daily.    Marland Kitchen apixaban (ELIQUIS) 5 MG TABS tablet Take 1 tablet (5 mg total) by mouth 2 (two) times daily. Pt is overdue for follow-up, Must see MD for Future refills. 180 tablet 0  . Cholecalciferol (VITAMIN D) 2000 UNITS tablet Take 2,000 Units by mouth daily.    . Coenzyme Q10 (CO Q-10) 50 MG CAPS Take 2 tablets by mouth daily. 100 MG TOTAL    . ezetimibe (ZETIA) 10 MG tablet Take 10 mg by mouth daily.    Marland Kitchen glimepiride (AMARYL) 4 MG tablet Take 4 mg by mouth daily with breakfast.    . ipratropium (ATROVENT) 0.03 % nasal spray Place 2 sprays into both nostrils as needed for allergies.    . Multiple Vitamin (MULTIVITAMIN) capsule  Take 1 capsule by mouth daily.    . nitroGLYCERIN (NITROSTAT) 0.4 MG SL tablet Place 1 tablet (0.4 mg total) under the tongue every 5 (five) minutes as needed for chest pain. 25 tablet 1   No current facility-administered medications for this visit.    Allergies:   Lipitor [atorvastatin] and Metformin and related    Social History:  The patient  reports that he quit smoking about 41 years ago. He has never used smokeless tobacco. He reports current alcohol use. He reports that he does not use drugs.   Family History:  The patient's family history includes CAD in his father and sister; COPD in his mother and sister; Emphysema in his mother and sister; Heart attack in his father.    ROS:  Please see the history of present illness.   Otherwise, review of systems are positive for recent move; no falls.   All other systems are  reviewed and negative.    PHYSICAL EXAM: VS:  BP 126/78   Pulse 65   Ht 5' 7.5" (1.715 m)   Wt 172 lb (78 kg)   SpO2 98%   BMI 26.54 kg/m  , BMI Body mass index is 26.54 kg/m. GEN: Well nourished, well developed, in no acute distress  HEENT: normal  Neck: no JVD, carotid bruits, or masses Cardiac: irregularly irregular; no murmurs, rubs, or gallops,no edema  Respiratory:  clear to auscultation bilaterally, normal work of breathing GI: soft, nontender, nondistended, + BS MS: no deformity or atrophy  Skin: warm and dry, no rash Neuro:  Strength and sensation are intact Psych: euthymic mood, full affect   EKG:   The ekg ordered today demonstrates AFib, RBBB, LAFB, bifascicular block   Recent Labs: No results found for requested labs within last 8760 hours.   Lipid Panel    Component Value Date/Time   CHOL 159 07/17/2014 0825   TRIG 114.0 07/17/2014 0825   HDL 37.50 (L) 07/17/2014 0825   CHOLHDL 4 07/17/2014 0825   VLDL 22.8 07/17/2014 0825   LDLCALC 99 07/17/2014 0825   LDLDIRECT 106.6 04/13/2014 1012     Other studies Reviewed: Additional studies/ records that were reviewed today with results demonstrating: Labs from primary care doctor reviewed..   ASSESSMENT AND PLAN:  1. CAD: Continue aggressive secondary prevention.  No angina.  2. Atrial flutter/atrial fibrillation/bifascicular block: Eliquis for stroke prevention.  Heart rate has actually been higher in atrial fibrillation compared to when he is in sinus rhythm.  He was seen by electrophysiology in the past due to his conduction disease but did not require pacemaker. 3. Hyperlipidemia: LDL 85 in April 2021. 4. HTN: The current medical regimen is effective;  continue present plan and medications. 5. DM: Whole food, plant-based diet recommended.  He has a garden and grows his own vegetables.  A1c 7 in April 2021.  Once he goes to [place], he will get a primary care doctor and then will need to be  referred to specialists.   Current medicines are reviewed at length with the patient today.  The patient concerns regarding his medicines were addressed.  The following changes have been made:  No change  Labs/ tests ordered today include:  No orders of the defined types were placed in this encounter.   Recommend 150 minutes/week of aerobic exercise Low fat, low carb, high fiber diet recommended  Disposition:   FU as needed   Signed, Larae Grooms, MD  01/01/2020 1:59 PM    [facility] [place], [place], [place]  [address] Phone: [phone]; Fax: [phone]

## 2020-01-01 ENCOUNTER — Encounter: Payer: Self-pay | Admitting: Interventional Cardiology

## 2020-01-01 ENCOUNTER — Ambulatory Visit: Payer: Medicare Other | Admitting: Interventional Cardiology

## 2020-01-01 ENCOUNTER — Other Ambulatory Visit: Payer: Self-pay

## 2020-01-01 VITALS — BP 126/78 | HR 65 | Ht 67.5 in | Wt 172.0 lb

## 2020-01-01 DIAGNOSIS — E782 Mixed hyperlipidemia: Secondary | ICD-10-CM | POA: Diagnosis not present

## 2020-01-01 DIAGNOSIS — I4891 Unspecified atrial fibrillation: Secondary | ICD-10-CM

## 2020-01-01 DIAGNOSIS — E1159 Type 2 diabetes mellitus with other circulatory complications: Secondary | ICD-10-CM

## 2020-01-01 DIAGNOSIS — I25119 Atherosclerotic heart disease of native coronary artery with unspecified angina pectoris: Secondary | ICD-10-CM

## 2020-01-01 DIAGNOSIS — I10 Essential (primary) hypertension: Secondary | ICD-10-CM | POA: Diagnosis not present

## 2020-01-01 MED ORDER — NITROGLYCERIN 0.4 MG SL SUBL: 0.4000 mg | SUBLINGUAL_TABLET | SUBLINGUAL | 3 refills | Status: AC | PRN

## 2020-01-01 NOTE — Patient Instructions (Signed)
Medication Instructions:  Your physician recommends that you continue on your current medications as directed. Please refer to the Current Medication list given to you today.   *If you need a refill on your cardiac medications before your next appointment, please call your pharmacy*   Lab Work: None   If you have labs (blood work) drawn today and your tests are completely normal, you will receive your results only by: . MyChart Message (if you have MyChart) OR . A paper copy in the mail If you have any lab test that is abnormal or we need to change your treatment, we will call you to review the results.   Testing/Procedures: None  Follow-Up: AS NEEDED   Other Instructions None  

## 2020-02-01 ENCOUNTER — Other Ambulatory Visit: Payer: Self-pay | Admitting: Interventional Cardiology

## 2020-02-02 NOTE — Telephone Encounter (Signed)
Eliquis 5mg  refill request received. Patient is 85 years old, weight-78kg, Crea-1.19 on 09/01/2019 via [facility] at [place], [place], and last seen by Dr. Irish Lack on 01/01/2020. Dose is appropriate based on dosing criteria. Will send in refill to requested pharmacy.

## 2020-06-04 ENCOUNTER — Encounter (INDEPENDENT_AMBULATORY_CARE_PROVIDER_SITE_OTHER): Payer: Medicare Other | Admitting: Ophthalmology
